# Patient Record
Sex: Female | Born: 1960 | Race: White | Hispanic: No | Marital: Married | Smoking: Former smoker
Health system: Southern US, Community
[De-identification: ages and names within clinical notes are randomized; demographics above are authoritative.]

## PROBLEM LIST (undated history)

## (undated) DIAGNOSIS — J45909 Unspecified asthma, uncomplicated: Secondary | ICD-10-CM

## (undated) HISTORY — PX: OOPHORECTOMY: SHX86

## (undated) HISTORY — PX: KNEE ARTHROSCOPY: SUR90

---

## 2005-05-22 ENCOUNTER — Ambulatory Visit: Payer: Self-pay | Admitting: Family Medicine

## 2006-07-09 ENCOUNTER — Ambulatory Visit: Payer: Self-pay | Admitting: Family Medicine

## 2007-07-12 ENCOUNTER — Ambulatory Visit: Payer: Self-pay | Admitting: Family Medicine

## 2008-07-13 ENCOUNTER — Ambulatory Visit: Payer: Self-pay | Admitting: Family Medicine

## 2009-07-16 ENCOUNTER — Ambulatory Visit: Payer: Self-pay | Admitting: Family Medicine

## 2010-07-30 ENCOUNTER — Ambulatory Visit: Payer: Self-pay | Admitting: Family Medicine

## 2010-08-18 ENCOUNTER — Ambulatory Visit: Payer: Self-pay | Admitting: Internal Medicine

## 2011-05-13 IMAGING — CR DG FOOT COMPLETE 3+V*L*
1 series · 3 of 3 positions shown · non-contrast
Comparison: none

REASON FOR EXAM: caught left side of foot and fourth and fith toes on
couch edge
COMMENTS:

PROCEDURE:     MDR - MDR FOOT LT COMP W/OBLQUES  - August 18, 2010  [DATE]
RESULT:     Comparison:  None

[Series 1: view not recorded · 0.17mm/px · 3 of 3 slices shown]
[im 1/3]
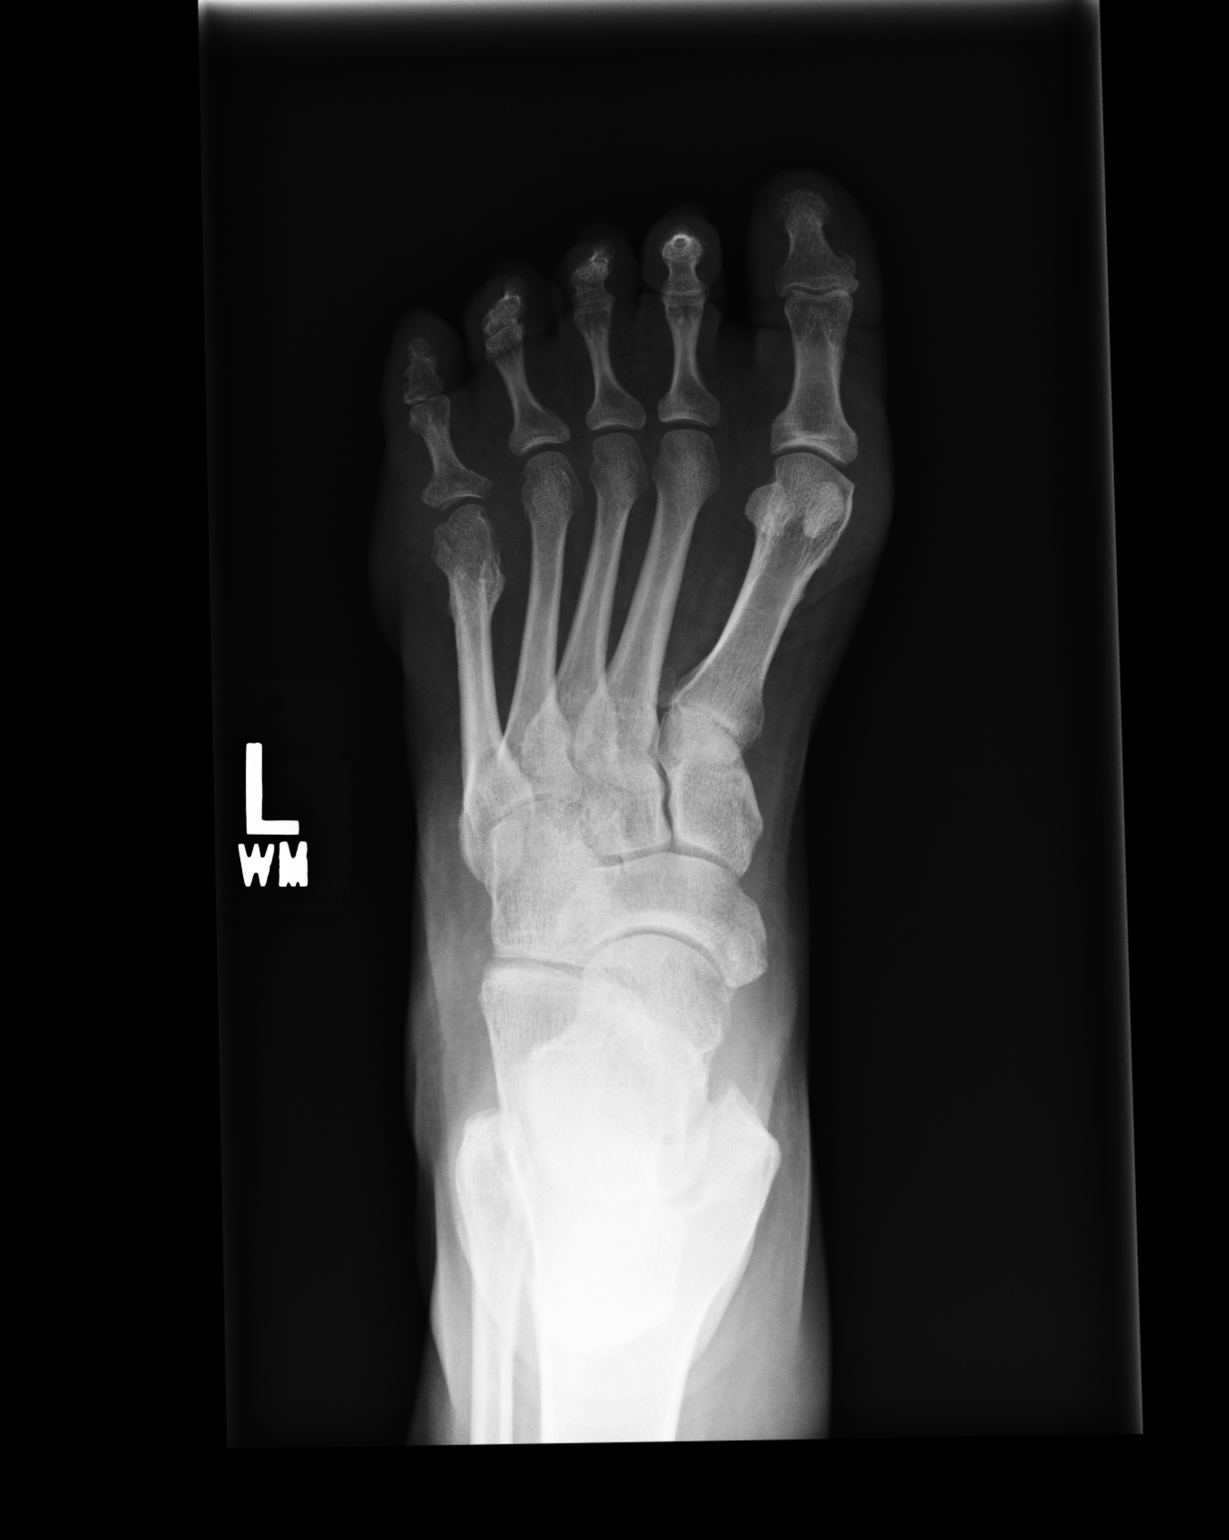
[im 2/3]
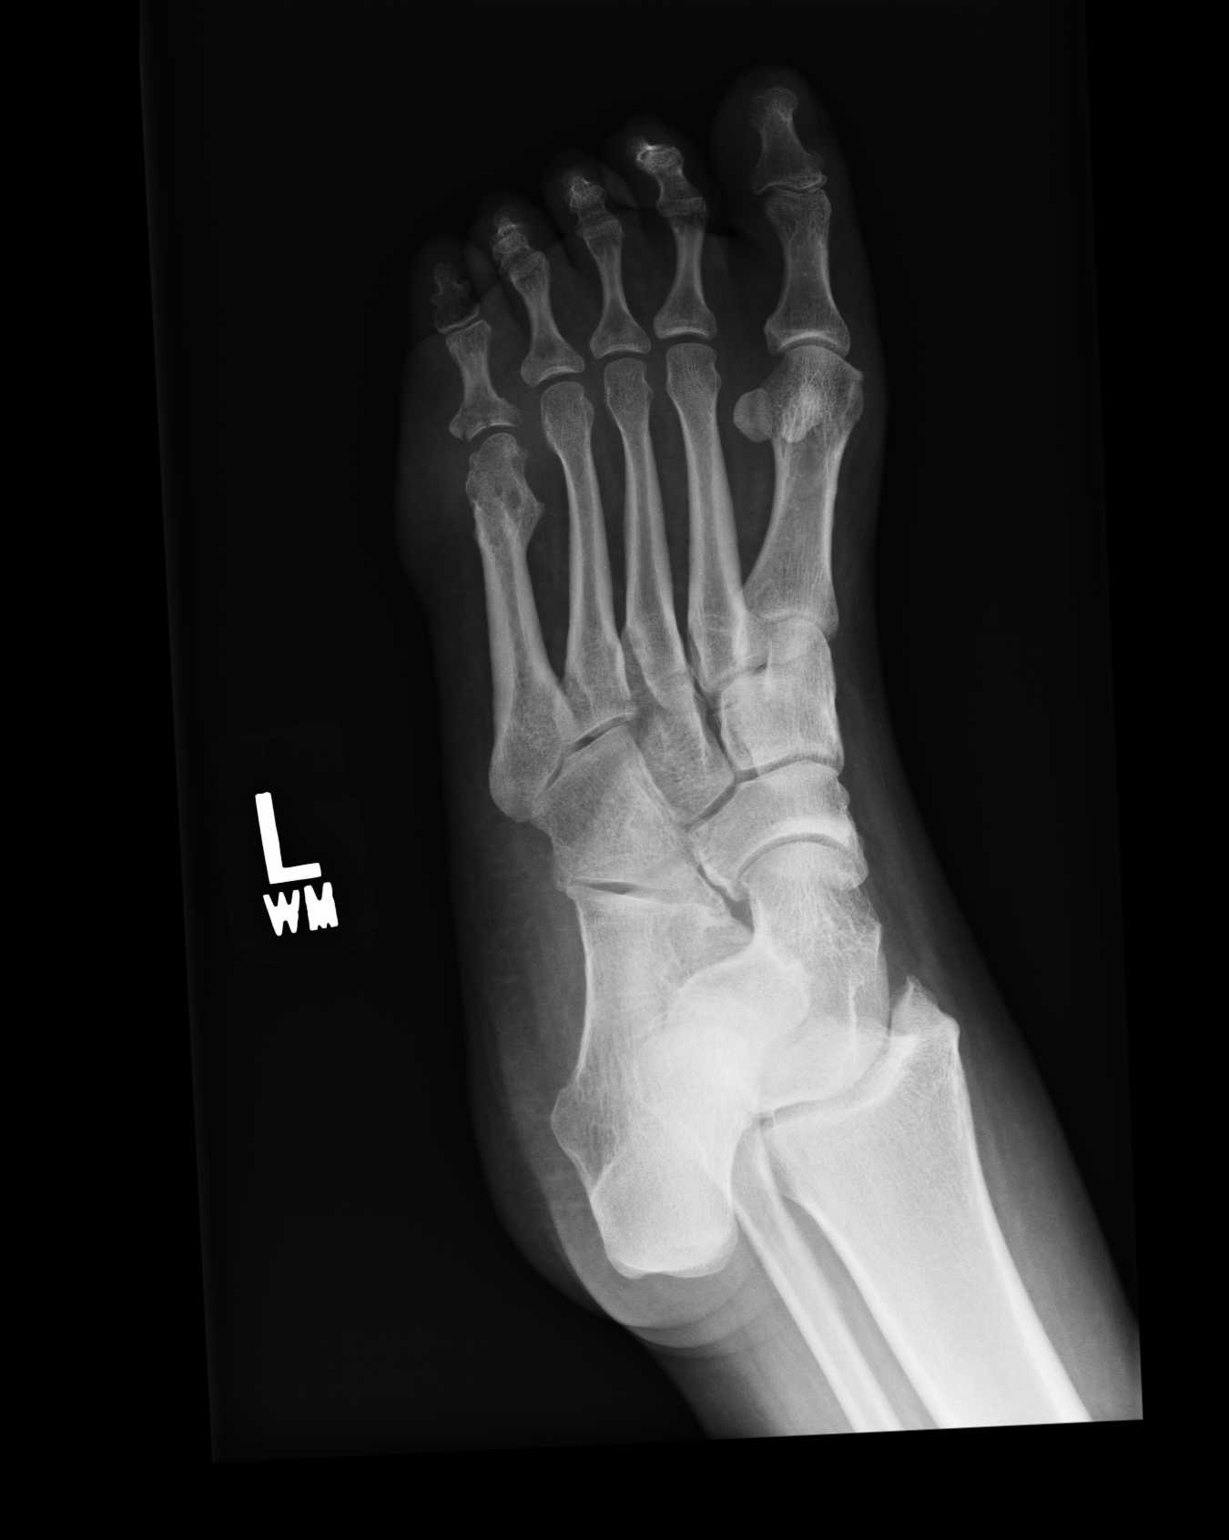
[im 3/3]
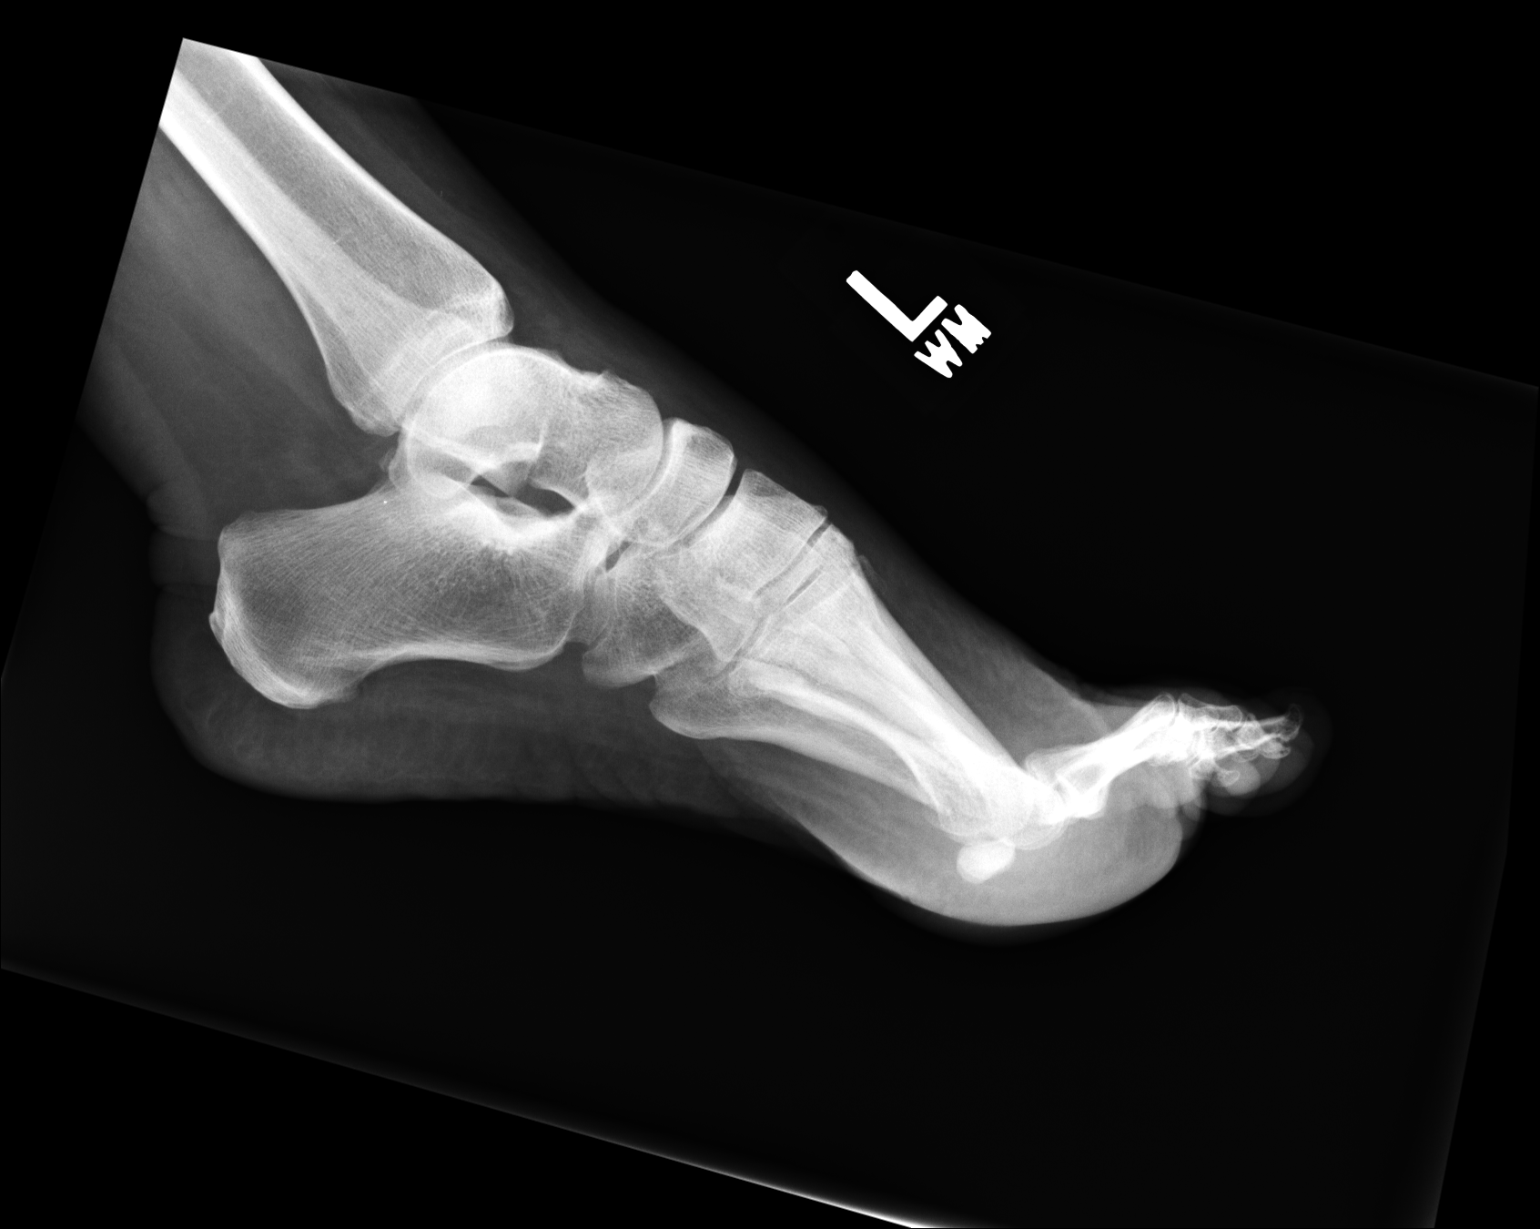

[3 of 3 positions shown; findings below may reference images not displayed]

FINDINGS: AP, oblique, and lateral views of the left foot demonstrates a nondisplaced
fracture of the base of the fifth proximal phalanx. There is no somatic
deformity of the fifth metatarsal neck.. There is no soft tissue
abnormality. There is no subcutaneous emphysema or radiopaque foreign bodies.
IMPRESSION: Nondisplaced fracture at the base of the fifth proximal phalanx of the left
foot.

## 2011-05-16 ENCOUNTER — Ambulatory Visit: Payer: Self-pay | Admitting: Orthopedic Surgery

## 2011-05-22 ENCOUNTER — Ambulatory Visit: Payer: Self-pay | Admitting: Orthopedic Surgery

## 2011-08-25 ENCOUNTER — Ambulatory Visit: Payer: Self-pay | Admitting: Family Medicine

## 2012-08-25 ENCOUNTER — Ambulatory Visit: Payer: Self-pay | Admitting: Family Medicine

## 2013-09-22 ENCOUNTER — Ambulatory Visit: Payer: Self-pay | Admitting: Family Medicine

## 2013-11-02 ENCOUNTER — Ambulatory Visit: Payer: Self-pay | Admitting: Family Medicine

## 2014-09-26 ENCOUNTER — Ambulatory Visit: Payer: Self-pay | Admitting: Family Medicine

## 2015-10-16 ENCOUNTER — Other Ambulatory Visit: Payer: Self-pay | Admitting: Family Medicine

## 2015-10-16 DIAGNOSIS — Z1231 Encounter for screening mammogram for malignant neoplasm of breast: Secondary | ICD-10-CM

## 2015-10-17 ENCOUNTER — Ambulatory Visit
Admission: RE | Admit: 2015-10-17 | Discharge: 2015-10-17 | Disposition: A | Discharge status: Home / Self Care | Payer: BLUE CROSS/BLUE SHIELD | Source: Ambulatory Visit | Attending: Family Medicine | Admitting: Family Medicine

## 2015-10-17 DIAGNOSIS — Z1231 Encounter for screening mammogram for malignant neoplasm of breast: Secondary | ICD-10-CM

## 2016-09-15 ENCOUNTER — Other Ambulatory Visit: Payer: Self-pay | Admitting: Family Medicine

## 2016-09-15 DIAGNOSIS — Z1231 Encounter for screening mammogram for malignant neoplasm of breast: Secondary | ICD-10-CM

## 2016-10-21 ENCOUNTER — Ambulatory Visit
Admission: RE | Admit: 2016-10-21 | Discharge: 2016-10-21 | Disposition: A | Discharge status: Home / Self Care | Payer: 59 | Source: Ambulatory Visit | Attending: Family Medicine | Admitting: Family Medicine

## 2016-10-21 DIAGNOSIS — Z1231 Encounter for screening mammogram for malignant neoplasm of breast: Secondary | ICD-10-CM | POA: Diagnosis present

## 2017-09-14 ENCOUNTER — Other Ambulatory Visit: Payer: Self-pay | Admitting: Family Medicine

## 2017-09-14 DIAGNOSIS — Z1231 Encounter for screening mammogram for malignant neoplasm of breast: Secondary | ICD-10-CM

## 2017-10-26 ENCOUNTER — Ambulatory Visit
Admission: RE | Admit: 2017-10-26 | Discharge: 2017-10-26 | Disposition: A | Discharge status: Home / Self Care | Payer: BLUE CROSS/BLUE SHIELD | Source: Ambulatory Visit | Attending: Family Medicine | Admitting: Family Medicine

## 2017-10-26 DIAGNOSIS — Z1231 Encounter for screening mammogram for malignant neoplasm of breast: Secondary | ICD-10-CM | POA: Insufficient documentation

## 2018-09-27 ENCOUNTER — Other Ambulatory Visit: Payer: Self-pay | Admitting: Family Medicine

## 2018-09-27 DIAGNOSIS — Z1231 Encounter for screening mammogram for malignant neoplasm of breast: Secondary | ICD-10-CM

## 2018-10-27 ENCOUNTER — Ambulatory Visit: Payer: BLUE CROSS/BLUE SHIELD

## 2018-11-23 ENCOUNTER — Ambulatory Visit
Admission: RE | Admit: 2018-11-23 | Discharge: 2018-11-23 | Disposition: A | Discharge status: Home / Self Care | Payer: BLUE CROSS/BLUE SHIELD | Source: Ambulatory Visit | Attending: Family Medicine | Admitting: Family Medicine

## 2018-11-23 ENCOUNTER — Encounter (INDEPENDENT_AMBULATORY_CARE_PROVIDER_SITE_OTHER): Payer: Self-pay

## 2018-11-23 DIAGNOSIS — Z1231 Encounter for screening mammogram for malignant neoplasm of breast: Secondary | ICD-10-CM

## 2019-04-18 ENCOUNTER — Other Ambulatory Visit: Payer: Self-pay | Admitting: Family Medicine

## 2019-04-18 DIAGNOSIS — N950 Postmenopausal bleeding: Secondary | ICD-10-CM

## 2019-05-02 ENCOUNTER — Ambulatory Visit
Admission: RE | Admit: 2019-05-02 | Discharge: 2019-05-02 | Disposition: A | Discharge status: Home / Self Care | Payer: BC Managed Care – PPO | Source: Ambulatory Visit | Attending: Family Medicine | Admitting: Family Medicine

## 2019-05-02 ENCOUNTER — Other Ambulatory Visit: Payer: Self-pay

## 2019-05-02 DIAGNOSIS — N950 Postmenopausal bleeding: Secondary | ICD-10-CM | POA: Diagnosis not present

## 2019-09-30 ENCOUNTER — Other Ambulatory Visit: Payer: Self-pay | Admitting: Family Medicine

## 2019-09-30 DIAGNOSIS — Z1231 Encounter for screening mammogram for malignant neoplasm of breast: Secondary | ICD-10-CM

## 2019-11-29 ENCOUNTER — Other Ambulatory Visit: Payer: Self-pay

## 2019-11-29 ENCOUNTER — Ambulatory Visit
Admission: RE | Admit: 2019-11-29 | Discharge: 2019-11-29 | Disposition: A | Discharge status: Home / Self Care | Payer: BC Managed Care – PPO | Source: Ambulatory Visit | Attending: Family Medicine | Admitting: Family Medicine

## 2019-11-29 DIAGNOSIS — Z1231 Encounter for screening mammogram for malignant neoplasm of breast: Secondary | ICD-10-CM | POA: Insufficient documentation

## 2019-12-31 DIAGNOSIS — U071 COVID-19: Secondary | ICD-10-CM

## 2019-12-31 HISTORY — DX: COVID-19: U07.1

## 2020-06-29 ENCOUNTER — Other Ambulatory Visit: Payer: Self-pay | Admitting: Obstetrics and Gynecology

## 2020-07-13 NOTE — H&P (View-Only) (Signed)
Nicole Stanley is a 59 y.o. female presenting with Pre Op Consulting (preop/sign consents)   Body mass index is 33.08 kg/m.  History of Present Illness: Patient is a patient of Dr. Guido Stanley. She presents for a preoperative visit for TLH/BSO. Surgical indications: postmenopausal bleeding, uterine fibroids, thickened endometrium on ultrasound. Pt had her initial PMB dx while on long-term OCPs in 2020, for which work up was completed, see below.   Of note, pt was on OCPs for several years up until age 56 with regular, interval, monthly periods. Additionally, pt has been with bothersome menopausal sx since age 25 and is s/p several interventions but most recently on Prempro with improvement except she is now with recent pelvic pain and pressure. Pt has requested hysterectomy and declines other interventions.  -04/2019: PMB dx, while on OCPs & having regular interval bleeding; TVUS: thickened endometrium of 77mm & multiple fibroids w/ largest measuring 6.1cm, pt saw CCW-->EMBx: benign, labs confirmed postmenopause -05/2019: stopped OCPs which she had been on since 2017 -02/2020: menopausal sx; start PremPro, vaginal estrogen -03/2020: new onset pelvic pain and pressure after starting Prempro but with improvements in menopausal sx --> pt now requests hysterectomy   Workup: Last pap 05/2018: neg/neg EMBx 05/2019:  -Inactive endometrium with reactive tubal metaplasia, neg for atypia & malignancy    (Most recent) TVUS 04/2020:  Fibroid Ut anteverted 8.2 x 5.5 x 4.5 cm 1) post=41 mm   2) rt lat=37 mm   3) rt ant=33 mm 4) lt lat=26 mm   5) post to endom=22 mm Endometrium=4.46 mm Bil ovs not seen, no adnexal masses seen  TVUS 04/2019: Ut: 9.6 x 6.7 x 6.8 cm = volume: 230 mL. A few uterine fibroids are seen, some which are partially calcified. Largest fibroid measures 6.1 cm in maximum diameter. Endometrium Thickness: 8 mm.  Neither ovary visualized, no adnexal masses  Pertinent Hx: -Regular period  with OCPs up until 58yo, LMP 06/2019 -OCPs restarted in 2017; Alyacen 1-35mg -mcg ; d/c late 2020 -Menopausal sx (hot flashes) since early 59yo, dyspareunia for vaginal dryness, 2021: thinning hair, burning tongue, weight gain   -S/p sprintec use  -02/2020: started vaginal estrogen & po Prempro daily, but pelvic cramping, pain, pressure & low back pain with Prempro but also much improvement -S/p cryotherapy for cervical dysplasia  -Last pap smear 05/2018 neg/neg  No hx of pelvic or gyn surgeries  Past Medical History:  has a past medical history of Abnormal cytology (Over 20 years ago), Allergic state (All my life), Arthritis (2012), Asthma, Cervical dysplasia, 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  Past Surgical History:  has a past surgical history that includes Knee arthroscopy (Right). Family History: family history includes Asthma in her father; COPD in her father; Cataracts in her mother; Charcot-Marie-Tooth disease in her maternal aunt, maternal grandfather, and mother; Coronary Artery Disease (Blocked arteries around heart) (age of onset: 77) in her maternal grandfather; Coronary Artery Disease (Blocked arteries around heart) (age of onset: 56) in her maternal uncle; Coronary Artery Disease (Blocked arteries around heart) (age of onset: 29) in her mother; Diabetes type II in her maternal grandfather and maternal uncle; Glaucoma in her maternal grandfather; High blood pressure (Hypertension) in her maternal grandfather and mother; Lung cancer in her maternal grandmother and mother; Osteoarthritis in her father; Osteoporosis (Thinning of bones) in her maternal aunt; Skin cancer in her maternal grandmother; Stroke in her maternal grandfather; Thyroid disease in  her mother. Social History:  reports that she quit smoking about 9 years ago. She has a 30.00  pack-year smoking history. She has never used smokeless tobacco. She reports current alcohol use. She reports that she does not use drugs. OB/GYN History:  OB History    Gravida  1   Para      Term      Preterm      AB  1   Living        SAB  1   TAB      Ectopic      Molar      Multiple      Live Births           Allergies: is allergic to penicillins. Medications: Current Outpatient Medications:  .  acetaminophen (TYLENOL) 500 MG tablet, Take by mouth, Disp: , Rfl:  .  docusate (COLACE) 100 MG capsule, Take by mouth, Disp: , Rfl:  .  estradioL (ESTRACE) 0.01 % (0.1 mg/gram) vaginal cream, Place 0.5 g vaginally twice a week Insert 1/4 applicator twice weekly, Disp: 30 g, Rfl: 2 .  estrogen, conjugated,-medroxyprogesterone (PREMPRO) 0.625-5 mg tablet, Take 1 tablet by mouth once daily, Disp: 1 Package, Rfl: 11 .  fluticasone propion-salmeteroL (ADVAIR DISKUS) 100-50 mcg/dose diskus inhaler, Inhale 1 inhalation into the lungs every 12 (twelve) hours, Disp: 1 each, Rfl: 11   Exam:   BP 142/80   Ht 153.7 cm (5' 0.5")   Wt 78.1 kg (172 lb 3.2 oz)   LMP 01/09/2015   BMI 33.08 kg/m   Constitutional:  General appearance: Well nourished, well developed female in no acute distress.  CV: RRR Pulm: CTAB Neuro/psych:  Normal mood and affect. No gross motor deficits. Neck:  Supple, normal appearance.  Respiratory:  Normal respiratory effort, no use of accessory muscles Skin:  No visible rashes or external lesions  Impression:   The primary encounter diagnosis was Preoperative clearance. Diagnoses of PMB (postmenopausal bleeding), Uterine leiomyoma, unspecified location, and Endometrial thickening on ultrasound were also pertinent to this visit.  Plan:   1. Preoperative Visit  Patient returns for a preoperative discussion regarding her plans to proceed with surgical treatment of her postmenopausal bleeding, uterine fibroids, thickened endometrium by total laparoscopic  hysterectomy with bilateral salpingo oophorectomy procedure. We may perform a cystoscopy to evaluate the urinary tract after the procedure, if surgically indicated for uro tract integrity.   The patient and I discussed the technical aspects of the procedure including the potential for risks and complications. These include but are not limited to the risk of infection requiring post-operative antibiotics or further procedures. We talked about the risk of injury to adjacent organs including bladder, bowel, ureter, blood vessels or nerves. We talked about the need to convert to an open incision. We talked about the possible need for blood transfusion. We talked about postop complications such as thromboembolic or cardiopulmonary complications. All of her questions were answered. Her preoperative exam was completed and the appropriate consents were signed. She is scheduled to undergo this procedure in the near future.  -Pt may stop her Prempro now since it is too costly per her. She would like to go ahead and start her new estrogen. Discussed with patient that this is appropriate.  -Will send in gabapentin along with Tylenol and Motrin; no progesterone indicated after surgery but pt okay to stop Prempro and start unopposed estrogen for 9 days.  Specific Peri-operative Considerations:  - Consent: obtained today - Health Maintenance: up  to date - Labs: CBC, CMP preoperatively - Studies: EKG, CXR preoperatively - Bowel Preparation: None required - Abx:  Ancef 2g - VTE ppx: SCDs perioperatively - Glucose Protocol: n/a - Beta-blockade: n/a  Diagnoses and all orders for this visit:  Preoperative clearance  PMB (postmenopausal bleeding)  Uterine leiomyoma, unspecified location  Endometrial thickening on ultrasound   Return for Postop check.

## 2020-07-13 NOTE — H&P (Signed)
Nicole Stanley is a 59 y.o. female presenting with Pre Op Consulting (preop/sign consents)   Body mass index is 33.08 kg/m.  History of Present Illness: Patient is a patient of Dr. Guido Sander. She presents for a preoperative visit for TLH/BSO. Surgical indications: postmenopausal bleeding, uterine fibroids, thickened endometrium on ultrasound. Pt had her initial PMB dx while on long-term OCPs in 2020, for which work up was completed, see below.   Of note, pt was on OCPs for several years up until age 2 with regular, interval, monthly periods. Additionally, pt has been with bothersome menopausal sx since age 84 and is s/p several interventions but most recently on Prempro with improvement except she is now with recent pelvic pain and pressure. Pt has requested hysterectomy and declines other interventions.  -04/2019: PMB dx, while on OCPs & having regular interval bleeding; TVUS: thickened endometrium of 1mm & multiple fibroids w/ largest measuring 6.1cm, pt saw CCW-->EMBx: benign, labs confirmed postmenopause -05/2019: stopped OCPs which she had been on since 2017 -02/2020: menopausal sx; start PremPro, vaginal estrogen -03/2020: new onset pelvic pain and pressure after starting Prempro but with improvements in menopausal sx --> pt now requests hysterectomy   Workup: Last pap 05/2018: neg/neg EMBx 05/2019:  -Inactive endometrium with reactive tubal metaplasia, neg for atypia & malignancy    (Most recent) TVUS 04/2020:  Fibroid Ut anteverted 8.2 x 5.5 x 4.5 cm 1) post=41 mm   2) rt lat=37 mm   3) rt ant=33 mm 4) lt lat=26 mm   5) post to endom=22 mm Endometrium=4.46 mm Bil ovs not seen, no adnexal masses seen  TVUS 04/2019: Ut: 9.6 x 6.7 x 6.8 cm = volume: 230 mL. A few uterine fibroids are seen, some which are partially calcified. Largest fibroid measures 6.1 cm in maximum diameter. Endometrium Thickness: 8 mm.  Neither ovary visualized, no adnexal masses  Pertinent Hx: -Regular period  with OCPs up until 58yo, LMP 06/2019 -OCPs restarted in 2017; Alyacen 1-35mg -mcg ; d/c late 2020 -Menopausal sx (hot flashes) since early 59yo, dyspareunia for vaginal dryness, 2021: thinning hair, burning tongue, weight gain   -S/p sprintec use  -02/2020: started vaginal estrogen & po Prempro daily, but pelvic cramping, pain, pressure & low back pain with Prempro but also much improvement -S/p cryotherapy for cervical dysplasia  -Last pap smear 05/2018 neg/neg  No hx of pelvic or gyn surgeries  Past Medical History:  has a past medical history of Abnormal cytology (Over 20 years ago), Allergic state (All my life), Arthritis (2012), Asthma, Cervical dysplasia, 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  Past Surgical History:  has a past surgical history that includes Knee arthroscopy (Right). Family History: family history includes Asthma in her father; COPD in her father; Cataracts in her mother; Charcot-Marie-Tooth disease in her maternal aunt, maternal grandfather, and mother; Coronary Artery Disease (Blocked arteries around heart) (age of onset: 40) in her maternal grandfather; Coronary Artery Disease (Blocked arteries around heart) (age of onset: 20) in her maternal uncle; Coronary Artery Disease (Blocked arteries around heart) (age of onset: 67) in her mother; Diabetes type II in her maternal grandfather and maternal uncle; Glaucoma in her maternal grandfather; High blood pressure (Hypertension) in her maternal grandfather and mother; Lung cancer in her maternal grandmother and mother; Osteoarthritis in her father; Osteoporosis (Thinning of bones) in her maternal aunt; Skin cancer in her maternal grandmother; Stroke in her maternal grandfather; Thyroid disease in  her mother. Social History:  reports that she quit smoking about 9 years ago. She has a 30.00  pack-year smoking history. She has never used smokeless tobacco. She reports current alcohol use. She reports that she does not use drugs. OB/GYN History:  OB History    Gravida  1   Para      Term      Preterm      AB  1   Living        SAB  1   TAB      Ectopic      Molar      Multiple      Live Births           Allergies: is allergic to penicillins. Medications: Current Outpatient Medications:  .  acetaminophen (TYLENOL) 500 MG tablet, Take by mouth, Disp: , Rfl:  .  docusate (COLACE) 100 MG capsule, Take by mouth, Disp: , Rfl:  .  estradioL (ESTRACE) 0.01 % (0.1 mg/gram) vaginal cream, Place 0.5 g vaginally twice a week Insert 1/4 applicator twice weekly, Disp: 30 g, Rfl: 2 .  estrogen, conjugated,-medroxyprogesterone (PREMPRO) 0.625-5 mg tablet, Take 1 tablet by mouth once daily, Disp: 1 Package, Rfl: 11 .  fluticasone propion-salmeteroL (ADVAIR DISKUS) 100-50 mcg/dose diskus inhaler, Inhale 1 inhalation into the lungs every 12 (twelve) hours, Disp: 1 each, Rfl: 11   Exam:   BP 142/80   Ht 153.7 cm (5' 0.5")   Wt 78.1 kg (172 lb 3.2 oz)   LMP 01/09/2015   BMI 33.08 kg/m   Constitutional:  General appearance: Well nourished, well developed female in no acute distress.  CV: RRR Pulm: CTAB Neuro/psych:  Normal mood and affect. No gross motor deficits. Neck:  Supple, normal appearance.  Respiratory:  Normal respiratory effort, no use of accessory muscles Skin:  No visible rashes or external lesions  Impression:   The primary encounter diagnosis was Preoperative clearance. Diagnoses of PMB (postmenopausal bleeding), Uterine leiomyoma, unspecified location, and Endometrial thickening on ultrasound were also pertinent to this visit.  Plan:   1. Preoperative Visit  Patient returns for a preoperative discussion regarding her plans to proceed with surgical treatment of her postmenopausal bleeding, uterine fibroids, thickened endometrium by total laparoscopic  hysterectomy with bilateral salpingo oophorectomy procedure. We may perform a cystoscopy to evaluate the urinary tract after the procedure, if surgically indicated for uro tract integrity.   The patient and I discussed the technical aspects of the procedure including the potential for risks and complications. These include but are not limited to the risk of infection requiring post-operative antibiotics or further procedures. We talked about the risk of injury to adjacent organs including bladder, bowel, ureter, blood vessels or nerves. We talked about the need to convert to an open incision. We talked about the possible need for blood transfusion. We talked about postop complications such as thromboembolic or cardiopulmonary complications. All of her questions were answered. Her preoperative exam was completed and the appropriate consents were signed. She is scheduled to undergo this procedure in the near future.  -Pt may stop her Prempro now since it is too costly per her. She would like to go ahead and start her new estrogen. Discussed with patient that this is appropriate.  -Will send in gabapentin along with Tylenol and Motrin; no progesterone indicated after surgery but pt okay to stop Prempro and start unopposed estrogen for 9 days.  Specific Peri-operative Considerations:  - Consent: obtained today - Health Maintenance: up  to date - Labs: CBC, CMP preoperatively - Studies: EKG, CXR preoperatively - Bowel Preparation: None required - Abx:  Ancef 2g - VTE ppx: SCDs perioperatively - Glucose Protocol: n/a - Beta-blockade: n/a  Diagnoses and all orders for this visit:  Preoperative clearance  PMB (postmenopausal bleeding)  Uterine leiomyoma, unspecified location  Endometrial thickening on ultrasound   Return for Postop check.

## 2020-07-16 ENCOUNTER — Encounter
Admission: RE | Admit: 2020-07-16 | Discharge: 2020-07-16 | Disposition: A | Discharge status: Home / Self Care | Payer: BC Managed Care – PPO | Source: Ambulatory Visit | Attending: Obstetrics and Gynecology | Admitting: Obstetrics and Gynecology

## 2020-07-16 ENCOUNTER — Other Ambulatory Visit: Payer: Self-pay

## 2020-07-16 HISTORY — DX: Unspecified asthma, uncomplicated: J45.909

## 2020-07-16 NOTE — Patient Instructions (Signed)
Your procedure is scheduled on: Mon 10/4 Report to Day Surgery. To find out your arrival time please call [phone] between 1PM - 3PM on Friday 10/1 .  Remember: Instructions that are not followed completely may result in serious medical risk,  up to and including death, or upon the discretion of your surgeon and anesthesiologist your  surgery may need to be rescheduled.     _X__ 1. Do not eat food after midnight the night before your procedure.                 No chewing gum or hard candies. You may drink clear liquids up to 2 hours                 before you are scheduled to arrive for your surgery- DO not drink clear                 liquids within 2 hours of the start of your surgery.                 Clear Liquids include:  water, apple juice without pulp, clear Gatorade, G2 or                  Gatorade Zero (avoid Red/Purple/Blue), Black Coffee or Tea (Do not add                 anything to coffee or tea). __x___2.   Complete the "Ensure Clear Pre-surgery Clear Carbohydrate Drink" provided to you, 2 hours before arrival. **If you       are diabetic you will be provided with an alternative drink, Gatorade Zero or G2.  __X__2.  On the morning of surgery brush your teeth with toothpaste and water, you                may rinse your mouth with mouthwash if you wish.  Do not swallow any toothpaste of mouthwash.     _X__ 3.  No Alcohol for 24 hours before or after surgery.   ___ 4.  Do Not Smoke or use e-cigarettes For 24 Hours Prior to Your Surgery.                 Do not use any chewable tobacco products for at least 6 hours prior to                 Surgery.  ___  5.  Do not use any recreational drugs (marijuana, cocaine, heroin, ecstasy, MDMA or other)                For at least one week prior to your surgery.  Combination of these drugs with anesthesia                May have life threatening results.  ____  6.  Bring all medications with you on the day of  surgery if instructed.   __x__  7.  Notify your doctor if there is any change in your medical condition      (cold, fever, infections).     Do not wear jewelry, make-up, hairpins, clips or nail polish. Do not wear lotions, powders, or perfumes. You may wear deodorant. Do not shave 48 hours prior to surgery. Do not bring valuables to the hospital.    [facility] is not responsible for any belongings or valuables.  Contacts, dentures or bridgework may not be worn into surgery. Leave your suitcase in the car. After  surgery it may be brought to your room. For patients admitted to the hospital, discharge time is determined by your treatment team.   Patients discharged the day of surgery will not be allowed to drive home.   Make arrangements for someone to be with you for the first 24 hours of your Same Day Discharge.    Please read over the following fact sheets that you were given:    ____ Take these medicines the morning of surgery with A SIP OF WATER:    1. none  2.   3.   4.  5.  6.  ____ Fleet Enema (as directed)   __x__ Use CHG Soap (or wipes) as directed  ____ Use Benzoyl Peroxide Gel as instructed  __x__ Use inhalers on the day of surgery ADVAIR DISKUS 100-50 MCG/DOSE AEPB  ____ Stop metformin 2 days prior to surgery    ____ Take 1/2 of usual insulin dose the night before surgery. No insulin the morning          of surgery.   ____ Stop Coumadin/Plavix/aspirin on   __x__ Stop Anti-inflammatories no ibuprofen aleve or aspirin      May take tylenol   ____ Stop supplements until after surgery.    ____ Bring C-Pap to the hospital.    If you have any questions regarding your pre-procedure instructions,  Please call Pre-admit Testing at [facility]

## 2020-07-19 ENCOUNTER — Other Ambulatory Visit: Payer: Self-pay

## 2020-07-19 ENCOUNTER — Encounter (HOSPITAL_COMMUNITY): Payer: Self-pay | Admitting: Urgent Care

## 2020-07-19 ENCOUNTER — Encounter: Payer: Self-pay | Admitting: Obstetrics and Gynecology

## 2020-07-19 ENCOUNTER — Other Ambulatory Visit
Admission: RE | Admit: 2020-07-19 | Discharge: 2020-07-19 | Disposition: A | Discharge status: Home / Self Care | Payer: BC Managed Care – PPO | Source: Ambulatory Visit | Attending: Obstetrics and Gynecology | Admitting: Obstetrics and Gynecology

## 2020-07-19 DIAGNOSIS — U071 COVID-19: Secondary | ICD-10-CM | POA: Insufficient documentation

## 2020-07-19 DIAGNOSIS — Z01812 Encounter for preprocedural laboratory examination: Secondary | ICD-10-CM | POA: Insufficient documentation

## 2020-07-19 LAB — SARS CORONAVIRUS 2 (TAT 6-24 HRS): SARS Coronavirus 2: POSITIVE — AB

## 2020-07-19 LAB — TYPE AND SCREEN
ABO/RH(D): A POS
Antibody Screen: NEGATIVE

## 2020-07-19 LAB — CBC
HCT: 33.5 % — ABNORMAL LOW (ref 36.0–46.0)
Hemoglobin: 11.6 g/dL — ABNORMAL LOW (ref 12.0–15.0)
MCH: 31.6 pg (ref 26.0–34.0)
MCHC: 34.6 g/dL (ref 30.0–36.0)
MCV: 91.3 fL (ref 80.0–100.0)
Platelets: 301 10*3/uL (ref 150–400)
RBC: 3.67 MIL/uL — ABNORMAL LOW (ref 3.87–5.11)
RDW: 12.5 % (ref 11.5–15.5)
WBC: 7.7 10*3/uL (ref 4.0–10.5)
nRBC: 0 % (ref 0.0–0.2)

## 2020-07-19 LAB — BASIC METABOLIC PANEL
Anion gap: 10 (ref 5–15)
BUN: 10 mg/dL (ref 6–20)
CO2: 25 mmol/L (ref 22–32)
Calcium: 8.7 mg/dL — ABNORMAL LOW (ref 8.9–10.3)
Chloride: 107 mmol/L (ref 98–111)
Creatinine, Ser: 0.49 mg/dL (ref 0.44–1.00)
GFR calc Af Amer: >60 mL/min (ref >60)
GFR calc non Af Amer: >60 mL/min (ref >60)
Glucose, Bld: 89 mg/dL (ref 70–99)
Potassium: 3.3 mmol/L — ABNORMAL LOW (ref 3.5–5.1)
Sodium: 142 mmol/L (ref 135–145)

## 2020-07-20 HISTORY — PX: ABDOMINAL HYSTERECTOMY: SHX81

## 2020-07-23 ENCOUNTER — Encounter: Admission: RE | Payer: Self-pay | Source: Home / Self Care

## 2020-07-23 ENCOUNTER — Ambulatory Visit
Admission: RE | Admit: 2020-07-23 | Payer: BC Managed Care – PPO | Source: Home / Self Care | Admitting: Obstetrics and Gynecology

## 2020-07-23 SURGERY — HYSTERECTOMY, TOTAL, LAPAROSCOPIC, WITH BILATERAL SALPINGO-OOPHORECTOMY
Anesthesia: Choice

## 2020-07-26 ENCOUNTER — Other Ambulatory Visit: Payer: Self-pay

## 2020-07-26 ENCOUNTER — Other Ambulatory Visit
Admission: RE | Admit: 2020-07-26 | Discharge: 2020-07-26 | Disposition: A | Discharge status: Home / Self Care | Payer: BC Managed Care – PPO | Source: Ambulatory Visit | Attending: Obstetrics and Gynecology | Admitting: Obstetrics and Gynecology

## 2020-07-26 DIAGNOSIS — Z01818 Encounter for other preprocedural examination: Secondary | ICD-10-CM | POA: Insufficient documentation

## 2020-07-26 DIAGNOSIS — Z20822 Contact with and (suspected) exposure to covid-19: Secondary | ICD-10-CM | POA: Diagnosis not present

## 2020-07-26 LAB — SARS CORONAVIRUS 2 (TAT 6-24 HRS): SARS Coronavirus 2: NEGATIVE

## 2020-07-29 MED ORDER — POVIDONE-IODINE 10 % EX SWAB: 2.0000 "application " | Freq: Once | CUTANEOUS | Status: DC

## 2020-07-29 MED ORDER — LACTATED RINGERS IV SOLN: INTRAVENOUS | Status: DC

## 2020-07-29 MED ORDER — GENTAMICIN SULFATE 40 MG/ML IJ SOLN
5.0000 mg/kg | INTRAVENOUS | Status: DC
  Filled 2020-07-29: qty 9.75

## 2020-07-29 MED ORDER — CLINDAMYCIN PHOSPHATE 900 MG/50ML IV SOLN
900.0000 mg | INTRAVENOUS | Status: AC
  Administered 2020-07-30: 900 mg via INTRAVENOUS

## 2020-07-30 ENCOUNTER — Encounter
Admission: RE | Disposition: A | Discharge status: Home / Self Care | Payer: Self-pay | Source: Home / Self Care | Attending: Obstetrics and Gynecology

## 2020-07-30 ENCOUNTER — Encounter: Payer: Self-pay | Admitting: Obstetrics and Gynecology

## 2020-07-30 ENCOUNTER — Other Ambulatory Visit: Payer: Self-pay

## 2020-07-30 ENCOUNTER — Ambulatory Visit: Payer: BC Managed Care – PPO | Admitting: Certified Registered"

## 2020-07-30 ENCOUNTER — Ambulatory Visit
Admission: RE | Admit: 2020-07-30 | Discharge: 2020-07-30 | Disposition: A | Discharge status: Home / Self Care | Payer: BC Managed Care – PPO | Attending: Obstetrics and Gynecology | Admitting: Obstetrics and Gynecology

## 2020-07-30 DIAGNOSIS — J45909 Unspecified asthma, uncomplicated: Secondary | ICD-10-CM | POA: Diagnosis not present

## 2020-07-30 DIAGNOSIS — M1990 Unspecified osteoarthritis, unspecified site: Secondary | ICD-10-CM | POA: Insufficient documentation

## 2020-07-30 DIAGNOSIS — Z880 Allergy status to penicillin: Secondary | ICD-10-CM | POA: Insufficient documentation

## 2020-07-30 DIAGNOSIS — N838 Other noninflammatory disorders of ovary, fallopian tube and broad ligament: Secondary | ICD-10-CM | POA: Diagnosis not present

## 2020-07-30 DIAGNOSIS — D251 Intramural leiomyoma of uterus: Secondary | ICD-10-CM | POA: Insufficient documentation

## 2020-07-30 DIAGNOSIS — Z87891 Personal history of nicotine dependence: Secondary | ICD-10-CM | POA: Insufficient documentation

## 2020-07-30 DIAGNOSIS — D259 Leiomyoma of uterus, unspecified: Secondary | ICD-10-CM | POA: Diagnosis present

## 2020-07-30 HISTORY — PX: TOTAL LAPAROSCOPIC HYSTERECTOMY WITH BILATERAL SALPINGO OOPHORECTOMY: SHX6845

## 2020-07-30 HISTORY — PX: CYSTOSCOPY: SHX5120

## 2020-07-30 LAB — ABO/RH: ABO/RH(D): A POS

## 2020-07-30 SURGERY — HYSTERECTOMY, TOTAL, LAPAROSCOPIC, WITH BILATERAL SALPINGO-OOPHORECTOMY
Anesthesia: General | Laterality: Bilateral

## 2020-07-30 MED ORDER — BUPIVACAINE HCL 0.5 % IJ SOLN
INTRAMUSCULAR | Status: DC | PRN
  Administered 2020-07-30: 13 mL

## 2020-07-30 MED ORDER — FENTANYL CITRATE (PF) 100 MCG/2ML IJ SOLN
INTRAMUSCULAR | Status: AC
  Filled 2020-07-30: qty 2

## 2020-07-30 MED ORDER — PROPOFOL 10 MG/ML IV BOLUS
INTRAVENOUS | Status: DC | PRN
  Administered 2020-07-30: 160 mg via INTRAVENOUS

## 2020-07-30 MED ORDER — ACETAMINOPHEN 10 MG/ML IV SOLN
INTRAVENOUS | Status: DC | PRN
  Administered 2020-07-30: 1000 mg via INTRAVENOUS

## 2020-07-30 MED ORDER — FENTANYL CITRATE (PF) 100 MCG/2ML IJ SOLN
25.0000 ug | INTRAMUSCULAR | Status: DC | PRN
  Administered 2020-07-30: 25 ug via INTRAVENOUS

## 2020-07-30 MED ORDER — FENTANYL CITRATE (PF) 100 MCG/2ML IJ SOLN
INTRAMUSCULAR | Status: AC
  Administered 2020-07-30: 25 ug via INTRAVENOUS
  Filled 2020-07-30: qty 2

## 2020-07-30 MED ORDER — IBUPROFEN 800 MG PO TABS: 800.0000 mg | ORAL_TABLET | Freq: Three times a day (TID) | ORAL | 1 refills | Status: AC

## 2020-07-30 MED ORDER — ROCURONIUM BROMIDE 10 MG/ML (PF) SYRINGE
PREFILLED_SYRINGE | INTRAVENOUS | Status: AC
  Filled 2020-07-30: qty 10

## 2020-07-30 MED ORDER — CLINDAMYCIN PHOSPHATE 900 MG/50ML IV SOLN
INTRAVENOUS | Status: AC
  Filled 2020-07-30: qty 50

## 2020-07-30 MED ORDER — GABAPENTIN 800 MG PO TABS: 800.0000 mg | ORAL_TABLET | Freq: Every day | ORAL | 0 refills | Status: DC

## 2020-07-30 MED ORDER — ONDANSETRON HCL 4 MG/2ML IJ SOLN
INTRAMUSCULAR | Status: DC | PRN
  Administered 2020-07-30: 4 mg via INTRAVENOUS

## 2020-07-30 MED ORDER — SEVOFLURANE IN SOLN
RESPIRATORY_TRACT | Status: AC
  Filled 2020-07-30: qty 250

## 2020-07-30 MED ORDER — SUGAMMADEX SODIUM 200 MG/2ML IV SOLN
INTRAVENOUS | Status: DC | PRN
  Administered 2020-07-30: 200 mg via INTRAVENOUS

## 2020-07-30 MED ORDER — PROMETHAZINE HCL 25 MG/ML IJ SOLN: 6.2500 mg | INTRAMUSCULAR | Status: DC | PRN

## 2020-07-30 MED ORDER — CHLORHEXIDINE GLUCONATE 0.12 % MT SOLN: 15.0000 mL | Freq: Once | OROMUCOSAL | Status: AC

## 2020-07-30 MED ORDER — ROCURONIUM BROMIDE 100 MG/10ML IV SOLN
INTRAVENOUS | Status: DC | PRN
  Administered 2020-07-30: 30 mg via INTRAVENOUS
  Administered 2020-07-30: 20 mg via INTRAVENOUS
  Administered 2020-07-30: 10 mg via INTRAVENOUS
  Administered 2020-07-30: 50 mg via INTRAVENOUS

## 2020-07-30 MED ORDER — ACETAMINOPHEN 500 MG PO TABS: 1000.0000 mg | ORAL_TABLET | Freq: Four times a day (QID) | ORAL | 0 refills | Status: AC

## 2020-07-30 MED ORDER — DOCUSATE SODIUM 100 MG PO CAPS: 100.0000 mg | ORAL_CAPSULE | Freq: Two times a day (BID) | ORAL | 0 refills | Status: DC

## 2020-07-30 MED ORDER — PROPOFOL 10 MG/ML IV BOLUS
INTRAVENOUS | Status: AC
  Filled 2020-07-30: qty 20

## 2020-07-30 MED ORDER — ACETAMINOPHEN 10 MG/ML IV SOLN
INTRAVENOUS | Status: AC
  Filled 2020-07-30: qty 100

## 2020-07-30 MED ORDER — OXYCODONE HCL 5 MG PO TABS: 5.0000 mg | ORAL_TABLET | ORAL | 0 refills | Status: DC | PRN

## 2020-07-30 MED ORDER — DEXAMETHASONE SODIUM PHOSPHATE 10 MG/ML IJ SOLN
INTRAMUSCULAR | Status: DC | PRN
  Administered 2020-07-30: 10 mg via INTRAVENOUS

## 2020-07-30 MED ORDER — ORAL CARE MOUTH RINSE: 15.0000 mL | Freq: Once | OROMUCOSAL | Status: AC

## 2020-07-30 MED ORDER — BUPIVACAINE HCL (PF) 0.5 % IJ SOLN
INTRAMUSCULAR | Status: AC
  Filled 2020-07-30: qty 30

## 2020-07-30 MED ORDER — DEXAMETHASONE SODIUM PHOSPHATE 10 MG/ML IJ SOLN
INTRAMUSCULAR | Status: AC
  Filled 2020-07-30: qty 1

## 2020-07-30 MED ORDER — MIDAZOLAM HCL 2 MG/2ML IJ SOLN
INTRAMUSCULAR | Status: AC
  Filled 2020-07-30: qty 2

## 2020-07-30 MED ORDER — FENTANYL CITRATE (PF) 100 MCG/2ML IJ SOLN
INTRAMUSCULAR | Status: DC | PRN
  Administered 2020-07-30 (×4): 50 ug via INTRAVENOUS

## 2020-07-30 MED ORDER — CHLORHEXIDINE GLUCONATE 0.12 % MT SOLN
OROMUCOSAL | Status: AC
  Administered 2020-07-30: 15 mL via OROMUCOSAL
  Filled 2020-07-30: qty 15

## 2020-07-30 MED ORDER — LIDOCAINE HCL (PF) 2 % IJ SOLN
INTRAMUSCULAR | Status: AC
  Filled 2020-07-30: qty 5

## 2020-07-30 MED ORDER — ONDANSETRON 4 MG PO TBDP: 4.0000 mg | ORAL_TABLET | Freq: Four times a day (QID) | ORAL | 0 refills | Status: DC | PRN

## 2020-07-30 MED ORDER — ONDANSETRON HCL 4 MG/2ML IJ SOLN
INTRAMUSCULAR | Status: AC
  Filled 2020-07-30: qty 2

## 2020-07-30 MED ORDER — LIDOCAINE HCL (CARDIAC) PF 100 MG/5ML IV SOSY
PREFILLED_SYRINGE | INTRAVENOUS | Status: DC | PRN
  Administered 2020-07-30: 100 mg via INTRAVENOUS

## 2020-07-30 SURGICAL SUPPLY — 64 items
ADH SKN CLS APL DERMABOND .7 (GAUZE/BANDAGES/DRESSINGS) ×2
APL PRP STRL LF DISP 70% ISPRP (MISCELLANEOUS) ×2
APL SRG 38 LTWT LNG FL B (MISCELLANEOUS) ×2
APPLICATOR ARISTA FLEXITIP XL (MISCELLANEOUS) ×3 IMPLANT
BAG DRN RND TRDRP ANRFLXCHMBR (UROLOGICAL SUPPLIES) ×4
BAG URINE DRAIN 2000ML AR STRL (UROLOGICAL SUPPLIES) ×6 IMPLANT
BLADE SURG SZ11 CARB STEEL (BLADE) ×3 IMPLANT
CATH FOLEY 2WAY  5CC 16FR (CATHETERS) ×1
CATH FOLEY 2WAY 5CC 16FR (CATHETERS) ×2
CATH URTH 16FR FL 2W BLN LF (CATHETERS) ×2 IMPLANT
CHLORAPREP W/TINT 26 (MISCELLANEOUS) ×3 IMPLANT
CORD MONOPOLAR M/FML 12FT (MISCELLANEOUS) ×3 IMPLANT
COUNTER NEEDLE 20/40 LG (NEEDLE) ×6 IMPLANT
COVER LIGHT HANDLE STERIS (MISCELLANEOUS) ×6 IMPLANT
COVER WAND RF STERILE (DRAPES) ×3 IMPLANT
DERMABOND ADVANCED (GAUZE/BANDAGES/DRESSINGS) ×1
DERMABOND ADVANCED .7 DNX12 (GAUZE/BANDAGES/DRESSINGS) ×2 IMPLANT
DEVICE SUTURE ENDOST 10MM (ENDOMECHANICALS) ×3 IMPLANT
DRAPE GENERAL ENDO 106X123.5 (DRAPES) ×3 IMPLANT
DRSG TEGADERM 2-3/8X2-3/4 SM (GAUZE/BANDAGES/DRESSINGS) ×9 IMPLANT
GAUZE 4X4 16PLY RFD (DISPOSABLE) ×3 IMPLANT
GLOVE BIO SURGEON STRL SZ7 (GLOVE) ×9 IMPLANT
GLOVE INDICATOR 7.5 STRL GRN (GLOVE) ×6 IMPLANT
GOWN STRL REUS W/ TWL LRG LVL3 (GOWN DISPOSABLE) ×6 IMPLANT
GOWN STRL REUS W/ TWL XL LVL3 (GOWN DISPOSABLE) ×2 IMPLANT
GOWN STRL REUS W/TWL LRG LVL3 (GOWN DISPOSABLE) ×9
GOWN STRL REUS W/TWL XL LVL3 (GOWN DISPOSABLE) ×3
GRASPER SUT TROCAR 14GX15 (MISCELLANEOUS) ×3 IMPLANT
HEMOSTAT ARISTA ABSORB 3G PWDR (HEMOSTASIS) ×3 IMPLANT
IRRIGATION STRYKERFLOW (MISCELLANEOUS) ×2 IMPLANT
IRRIGATOR STRYKERFLOW (MISCELLANEOUS) ×3
KIT PINK PAD W/HEAD ARE REST (MISCELLANEOUS) ×3
KIT PINK PAD W/HEAD ARM REST (MISCELLANEOUS) ×2 IMPLANT
KIT TURNOVER CYSTO (KITS) ×3 IMPLANT
L-HOOK LAP DISP 36CM (ELECTROSURGICAL)
LABEL OR SOLS (LABEL) ×3 IMPLANT
LHOOK LAP DISP 36CM (ELECTROSURGICAL) IMPLANT
LIGASURE VESSEL 5MM BLUNT TIP (ELECTROSURGICAL) IMPLANT
MANIPULATOR VCARE LG CRV RETR (MISCELLANEOUS) IMPLANT
MANIPULATOR VCARE SML CRV RETR (MISCELLANEOUS) IMPLANT
MANIPULATOR VCARE STD CRV RETR (MISCELLANEOUS) ×3 IMPLANT
NS IRRIG 500ML POUR BTL (IV SOLUTION) ×3 IMPLANT
OCCLUDER COLPOPNEUMO (BALLOONS) ×3 IMPLANT
PACK GYN LAPAROSCOPIC (MISCELLANEOUS) ×3 IMPLANT
PAD OB MATERNITY 4.3X12.25 (PERSONAL CARE ITEMS) ×3 IMPLANT
PAD PREP 24X41 OB/GYN DISP (PERSONAL CARE ITEMS) ×3 IMPLANT
PENCIL ELECTRO HAND CTR (MISCELLANEOUS) IMPLANT
SCISSORS METZENBAUM CVD 33 (INSTRUMENTS) IMPLANT
SET CYSTO W/LG BORE CLAMP LF (SET/KITS/TRAYS/PACK) IMPLANT
SLEEVE ENDOPATH XCEL 5M (ENDOMECHANICALS) ×3 IMPLANT
SPONGE GAUZE 2X2 8PLY STRL LF (GAUZE/BANDAGES/DRESSINGS) ×6 IMPLANT
SUT ENDO VLOC 180-0-8IN (SUTURE) ×6 IMPLANT
SUT MNCRL 4-0 (SUTURE) ×3
SUT MNCRL 4-0 27XMFL (SUTURE) ×2
SUT VIC AB 0 CT1 36 (SUTURE) ×12 IMPLANT
SUT VIC AB 0 CT2 27 (SUTURE) ×3 IMPLANT
SUTURE MNCRL 4-0 27XMF (SUTURE) ×2 IMPLANT
SYR 10ML LL (SYRINGE) ×3 IMPLANT
SYR 50ML LL SCALE MARK (SYRINGE) ×3 IMPLANT
SYR TOOMEY IRRIG 70ML (MISCELLANEOUS) ×3
SYRINGE TOOMEY IRRIG 70ML (MISCELLANEOUS) ×2 IMPLANT
TROCAR ENDO BLADELESS 11MM (ENDOMECHANICALS) ×3 IMPLANT
TROCAR XCEL NON-BLD 5MMX100MML (ENDOMECHANICALS) ×3 IMPLANT
TUBING EVAC SMOKE HEATED PNEUM (TUBING) ×3 IMPLANT

## 2020-07-30 NOTE — Anesthesia Preprocedure Evaluation (Signed)
Anesthesia Evaluation  Patient identified by MRN, date of birth, ID band Patient awake    Reviewed: Allergy & Precautions, H&P , NPO status , Patient's Chart, lab work & pertinent test results, reviewed documented beta blocker date and time   History of Anesthesia Complications Negative for: history of anesthetic complications  Airway Mallampati: II  TM Distance: >3 FB Neck ROM: full    Dental  (+) Dental Advidsory Given, Implants, Teeth Intact   Pulmonary neg shortness of breath, asthma , neg sleep apnea, neg recent URI, former smoker,    Pulmonary exam normal breath sounds clear to auscultation       Cardiovascular Exercise Tolerance: Good negative cardio ROS Normal cardiovascular exam Rhythm:regular Rate:Normal     Neuro/Psych negative neurological ROS  negative psych ROS   GI/Hepatic negative GI ROS, Neg liver ROS,   Endo/Other  negative endocrine ROS  Renal/GU negative Renal ROS  negative genitourinary   Musculoskeletal   Abdominal   Peds  Hematology negative hematology ROS (+)   Anesthesia Other Findings Past Medical History: No date: Asthma 12/31/2019: Laboratory confirmed diagnosis of COVID-19   Reproductive/Obstetrics negative OB ROS                             Anesthesia Physical Anesthesia Plan  ASA: II  Anesthesia Plan: General   Post-op Pain Management:    Induction: Intravenous  PONV Risk Score and Plan: 3 and Ondansetron, Dexamethasone, Midazolam, Promethazine and Treatment may vary due to age or medical condition  Airway Management Planned: Oral ETT  Additional Equipment:   Intra-op Plan:   Post-operative Plan: Extubation in OR  Informed Consent: I have reviewed the patients History and Physical, chart, labs and discussed the procedure including the risks, benefits and alternatives for the proposed anesthesia with the patient or authorized representative  who has indicated his/her understanding and acceptance.     Dental Advisory Given  Plan Discussed with: Anesthesiologist, CRNA and Surgeon  Anesthesia Plan Comments:         Anesthesia Quick Evaluation

## 2020-07-30 NOTE — Discharge Instructions (Signed)
Discharge instructions after   total laparoscopic hysterectomy   For the next three days, take ibuprofen and acetaminophen on a schedule, every 8 hours. You can take them together or you can intersperse them, and take one every four hours. I also gave you gabapentin for nighttime, to help you sleep and also to control pain. Take gabapentin medicines at night for at least the next 3 nights. You also have a narcotic, oxycodone, to take as needed if the above medicines don't help.  Postop constipation is a major cause of pain. Stay well hydrated, walk as you tolerate, and take over the counter senna as well as stool softeners if you need them.    Signs and Symptoms to Report Call our office at [phone] if you have any of the following.  . Fever over 100.4 degrees or higher . Severe stomach pain not relieved with pain medications . Bright red bleeding that's heavier than a period that does not slow with rest . To go the bathroom a lot (frequency), you can't hold your urine (urgency), or it hurts when you empty your bladder (urinate) . Chest pain . Shortness of breath . Pain in the calves of your legs . Severe nausea and vomiting not relieved with anti-nausea medications . Signs of infection around your wounds, such as redness, hot to touch, swelling, green/yellow drainage (like pus), bad smelling discharge . Any concerns  What You Can Expect after Surgery . You may see some pink tinged, bloody fluid and bruising around the wound. This is normal. . You may notice shoulder and neck pain. This is caused by the gas used during surgery to expand your abdomen so your surgeon could get to the uterus easier. . You may have a sore throat because of the tube in your mouth during general anesthesia. This will go away in 2 to 3 days. . You may have some stomach cramps. . You may notice spotting on your panties. . You may have pain around the incision sites.   Activities after Your  Discharge Follow these guidelines to help speed your recovery at home: . Do the coughing and deep breathing as you did in the hospital for 2 weeks. Use the small blue breathing device, called the incentive spirometer for 2 weeks. . Don't drive if you are in pain or taking narcotic pain medicine. You may drive when you can safely slam on the brakes, turn the wheel forcefully, and rotate your torso comfortably. This is typically 1-2 weeks. Practice in a parking lot or side street prior to attempting to drive regularly.  . Ask others to help with household chores for 4 weeks. . Do not lift anything heavier that 10 pounds for 4-6 weeks. This includes pets, children, and groceries. . Don't do strenuous activities, exercises, or sports like vacuuming, tennis, squash, etc. until your doctor says it is safe to do so. ---Maintain pelvic rest for 8 weeks. This means nothing in the vagina or rectum at all (no douching, tampons, intercourse) for 8 weeks.  . Walk as you feel able. Rest often since it may take two or three weeks for your energy level to return to normal.  . You may climb stairs . Avoid constipation:   -Eat fruits, vegetables, and whole grains. Eat small meals as your appetite will take time to return to normal.   -Drink 6 to 8 glasses of water each day unless your doctor has told you to limit your fluids.   -Use a laxative   or stool softener as needed if constipation becomes a problem. You may take Miralax, metamucil, Citrucil, Colace, Senekot, FiberCon, etc. If this does not relieve the constipation, try two tablespoons of Milk Of Magnesia every 8 hours until your bowels move.  . You may shower. Gently wash the wounds with a mild soap and water. Pat dry. . Do not get in a hot tub, swimming pool, etc. for 6 weeks. . Do not use lotions, oils, powders on the wounds. . Do not douche, use tampons, or have sex until your doctor says it is okay. . Take your pain medicine when you need it. The medicine  may not work as well if the pain is bad.  Take the medicines you were taking before surgery. Other medications you will need are pain medications and possibly constipation and nausea medications (Zofran).    AMBULATORY SURGERY  DISCHARGE INSTRUCTIONS   1) The drugs that you were given will stay in your system until tomorrow so for the next 24 hours you should not:  A) Drive an automobile B) Make any legal decisions C) Drink any alcoholic beverage   2) You may resume regular meals tomorrow.  Today it is better to start with liquids and gradually work up to solid foods.  You may eat anything you prefer, but it is better to start with liquids, then soup and crackers, and gradually work up to solid foods.   3) Please notify your doctor immediately if you have any unusual bleeding, trouble breathing, redness and pain at the surgery site, drainage, fever, or pain not relieved by medication.    4) Additional Instructions:        Please contact your physician with any problems or Same Day Surgery at [phone], Monday through Friday 6 am to 4 pm, or [place] [facility] [place] [facility] number at [phone]. 

## 2020-07-30 NOTE — Interval H&P Note (Signed)
History and Physical Interval Note:  07/30/2020 11:07 AM  Nicole Stanley  has presented today for surgery, with the diagnosis of fibroid uterus.  The various methods of treatment have been discussed with the patient and family. After consideration of risks, benefits and other options for treatment, the patient has consented to  Procedure(s): TOTAL LAPAROSCOPIC HYSTERECTOMY WITH BILATERAL SALPINGO OOPHORECTOMY (Bilateral) as a surgical intervention.  The patient's history has been reviewed, patient examined, no change in status, stable for surgery.  I have reviewed the patient's chart and labs.  Questions were answered to the patient's satisfaction.     Benjaman Kindler

## 2020-07-30 NOTE — Op Note (Signed)
Nicole Stanley PROCEDURE DATE: 07/30/2020  PREOPERATIVE DIAGNOSIS: Fibroid uterus, AUB POSTOPERATIVE DIAGNOSIS: The same PROCEDURE:  TOTAL LAPAROSCOPIC HYSTERECTOMY WITH BILATERAL SALPINGO OOPHORECTOMY:  CYSTOSCOPY:   SURGEON:  Dr. Benjaman Kindler, MD ASSISTANT: Dr. Larey Days, MD  Anesthesiologist:  Anesthesiologist: Martha Clan, MD; Gunnar Fusi, MD CRNA: Kelton Pillar, CRNA; Nelda Marseille, CRNA; Philbert Riser, CRNA  INDICATIONS: 59 y.o. F  here for definitive surgical management secondary to the indications listed under preoperative diagnoses; please see preoperative note for further details.  Risks of surgery were discussed with the patient including but not limited to: bleeding which may require transfusion or reoperation; infection which may require antibiotics; injury to bowel, bladder, ureters or other surrounding organs; need for additional procedures; thromboembolic phenomenon, incisional problems and other postoperative/anesthesia complications. Written informed consent was obtained.    FINDINGS:  Enlarged fibroid uterus, normal ovaries and tubes bilaterally. Several large fundal calcified fibroids. The right ureter was not visualized prior to taking the IP, and therefore the medial aspect of the right broad was developed and a cysto performed after surgery. She also bled from all incisions, but minimally. We used arista intraperitoneally, and the cuff was closed from below.   ANESTHESIA:    General INTRAVENOUS FLUIDS:1300  ml ESTIMATED BLOOD LOSS:25 ml URINE OUTPUT: 600 ml  SPECIMENS: Uterus, cervix, bilateral fallopian tubes and bilateral ovaries COMPLICATIONS: None immediate  PROCEDURE IN DETAIL:  The patient received prophalactic intravenous antibiotics and had sequential compression devices applied to her lower extremities while in the preoperative area.  She was then taken to the operating room where general anesthesia was administered and  was found to be adequate.  She was placed in the dorsal lithotomy position, and was prepped and draped in a sterile manner.  A formal time out was performed with all team members present and in agreement.  A V-care uterine manipulator was placed at this time.  A Foley catheter was inserted into her bladder and attached to constant drainage. Attention was turned to the abdomen where an umbilical incision was made with the scalpel.  The Optiview 5-mm trocar and sleeve were then advanced without difficulty with the laparoscope under direct visualization into the abdomen.  The abdomen was then insufflated with carbon dioxide gas and adequate pneumoperitoneum was obtained.  A survey of the patient's pelvis and abdomen revealed the findings above.  Bilateral lower quadrant ports (5 mm on the right and 5 mm on the left) were then placed under direct visualization.  The pelvis was then carefully examined. The medial leaflet of the broad was developed on the right. The left ureter was visualized.   Attention was turned to the IP ligaments. These were fulgurated and ligated, freeing the ovaries from the pelvic sidewall. The broad ligament was transected and the anterior and posterior leaflets of the broad opened. The uterine artery was then skeletonized and a bladder flap was created.  The ureters were noted to be safely away from the area of dissection.  The bladder was then bluntly dissected off the lower uterine segment.  The anterior fibroid made this difficult and care was taken to avoid damage to the bladder at the done  At this point, attention was turned to the uterine vessels, which were clamped and ligated using the Ligasure.  Good hemostasis was noted overall.  The uterosacral and cardinal ligaments were clamped, cut and ligated bilaterally .  Attention was then turned to the cervicovaginal junction, and the bipolar scissors were used to transect the  cervix from the surrounding vagina using the ring of the  V-care as a guide. This was done circumferentially allowing total hysterectomy.  The uterus was then removed from the vagina and the vaginal cuff incision was then closed with running V-loc.  Overall excellent hemostasis was noted.    Attention was returned to the abdomen.The ureters were reexamined bilaterally and were pulsating normally. The abdominal pressure was reduced and hemostasis was confirmed.   Intravenous floruoceine was administered, and cystoscopy showed bilateral ureteral jets.  No stitches were visualized in the bladder during cystoscopy.  The 84mm port fascia was closed with a vertical mattress with 0-Vicryl, using the cone closure system. All trocars were removed under direct visualization, and the abdomen was desufflated.  The fascial incision of the umbilicus was closed with a 0 Vicryl figure of eight stitch.  All skin incisions were closed with 4-0 Vicryl subcuticular stitches and Dermabond. The patient tolerated the procedures well.  All instruments, needles, and sponge counts were correct x 2. The patient was taken to the recovery room awake, extubated and in stable condition.

## 2020-07-30 NOTE — Anesthesia Postprocedure Evaluation (Signed)
Anesthesia Post Note  Patient: Nicole Stanley  Procedure(s) Performed: TOTAL LAPAROSCOPIC HYSTERECTOMY WITH BILATERAL SALPINGO OOPHORECTOMY (Bilateral ) CYSTOSCOPY  Patient location during evaluation: PACU Anesthesia Type: General Level of consciousness: awake and alert Pain management: pain level controlled Vital Signs Assessment: post-procedure vital signs reviewed and stable Respiratory status: spontaneous breathing and respiratory function stable Cardiovascular status: stable Anesthetic complications: no   No complications documented.   Last Vitals:  Vitals:   07/30/20 0922 07/30/20 1443  BP: (!) 150/85 (!) 163/81  Pulse: 84 88  Resp: 20 (!) 23  Temp: 37.1 C (!) 36.3 C  SpO2: 99% 100%    Last Pain:  Vitals:   07/30/20 1443  TempSrc:   PainSc: Asleep                 Gisele Pack K

## 2020-07-30 NOTE — Anesthesia Procedure Notes (Signed)
Performed by: Shaylen Nephew E, CRNA       

## 2020-07-30 NOTE — Anesthesia Procedure Notes (Signed)
Procedure Name: Intubation Date/Time: 07/30/2020 11:26 AM Performed by: Philbert Riser, CRNA Pre-anesthesia Checklist: Patient identified, Emergency Drugs available, Suction available, Patient being monitored and Timeout performed Patient Re-evaluated:Patient Re-evaluated prior to induction Oxygen Delivery Method: Circle system utilized and Simple face mask Preoxygenation: Pre-oxygenation with 100% oxygen Induction Type: IV induction Ventilation: Mask ventilation without difficulty Laryngoscope Size: Mac and 3 Grade View: Grade I Tube type: Oral Tube size: 7.0 mm Number of attempts: 1 Airway Equipment and Method: Stylet Secured at: 20 cm Tube secured with: Tape Dental Injury: Teeth and Oropharynx as per pre-operative assessment

## 2020-07-30 NOTE — Transfer of Care (Signed)
Immediate Anesthesia Transfer of Care Note  Patient: Nicole Stanley  Procedure(s) Performed: TOTAL LAPAROSCOPIC HYSTERECTOMY WITH BILATERAL SALPINGO OOPHORECTOMY (Bilateral ) CYSTOSCOPY  Patient Location: PACU  Anesthesia Type:General  Level of Consciousness: awake and oriented  Airway & Oxygen Therapy: Patient Spontanous Breathing and Patient connected to face mask oxygen  Post-op Assessment: Report given to RN and Post -op Vital signs reviewed and stable  Post vital signs: Reviewed  Last Vitals:  Vitals Value Taken Time  BP 163/81 07/30/20 1443  Temp 36.3 C 07/30/20 1443  Pulse 83 07/30/20 1443  Resp 19 07/30/20 1443  SpO2 100 % 07/30/20 1443  Vitals shown include unvalidated device data.  Last Pain:  Vitals:   07/30/20 1443  TempSrc:   PainSc: Asleep         Complications: No complications documented.

## 2020-07-31 ENCOUNTER — Encounter: Payer: Self-pay | Admitting: Obstetrics and Gynecology

## 2020-08-02 LAB — SURGICAL PATHOLOGY

## 2020-10-22 ENCOUNTER — Other Ambulatory Visit: Payer: Self-pay | Admitting: Physician Assistant

## 2020-10-22 DIAGNOSIS — Z1231 Encounter for screening mammogram for malignant neoplasm of breast: Secondary | ICD-10-CM

## 2020-11-29 ENCOUNTER — Other Ambulatory Visit: Payer: Self-pay

## 2020-11-29 ENCOUNTER — Ambulatory Visit
Admission: RE | Admit: 2020-11-29 | Discharge: 2020-11-29 | Disposition: A | Discharge status: Home / Self Care | Payer: BC Managed Care – PPO | Source: Ambulatory Visit | Attending: Physician Assistant | Admitting: Physician Assistant

## 2020-11-29 DIAGNOSIS — Z1231 Encounter for screening mammogram for malignant neoplasm of breast: Secondary | ICD-10-CM

## 2021-10-29 ENCOUNTER — Other Ambulatory Visit: Payer: Self-pay | Admitting: Family Medicine

## 2021-10-29 DIAGNOSIS — Z1231 Encounter for screening mammogram for malignant neoplasm of breast: Secondary | ICD-10-CM

## 2022-01-20 ENCOUNTER — Ambulatory Visit
Admission: RE | Admit: 2022-01-20 | Discharge: 2022-01-20 | Disposition: A | Discharge status: Home / Self Care | Payer: BC Managed Care – PPO | Source: Ambulatory Visit | Attending: Family Medicine | Admitting: Family Medicine

## 2022-01-20 DIAGNOSIS — Z1231 Encounter for screening mammogram for malignant neoplasm of breast: Secondary | ICD-10-CM | POA: Diagnosis not present

## 2022-01-20 NOTE — Progress Notes (Signed)
? ?01/21/22 ?1:20 PM  ? ?Nicole Stanley ?[birth year] ?[id number] ? ?Referring provider:  ?Hortencia Pilar, MD ?[address], [address] ?[place],  [place] [address] ?Chief Complaint  ?Patient presents with  ? Hematuria  ? ? ? ?HPI: ?Nicole Stanley is a 61 y.o.female who presents today for further evaluation of microscopic hematuria.  ? ?She has a personal history pelvic pain and hysterectomy for which she is seeing gynecology. ? ?She was seen by her PCP Dr Weston BrassRodena Piety, on 09/18/2021, for urinary frequency, at time of visit she had microscopic blood. Urinalysis had 2+ blood but was otherwise unremarkable.  ? ?A CT urogram was ordered to further evaluate her hematuria at [facility], on 10/03/2021. It visualized no acute/ inflammatory findings of the abdomen or pelvis. No VT evidence for etiology of hematuria.  ? ?She has a recent urinalysis at [facility] that showed moderate blood, 10-50 RBCs, and  rare bacteria.  ? ?She reports today that she had 2 knee replacements last year and she had a urinalysis that had microscopic hematuria. She had increased urinary tract infection symptoms in December 2022, and she self treated. She remarks that she has been told about microscopic blood in her urine for the past year.  ? ?She has a history of smoking x1 pack per 2 days for 35 years; she stopped smoking in 2020. ?  ?PMH: ?Past Medical History:  ?Diagnosis Date  ? Asthma   ? Laboratory confirmed diagnosis of COVID-19 12/31/2019  ? ? ?Surgical History: ?Past Surgical History:  ?Procedure Laterality Date  ? ABDOMINAL HYSTERECTOMY  07/2020  ? CYSTOSCOPY  07/30/2020  ? Procedure: CYSTOSCOPY;  Surgeon: Benjaman Kindler, MD;  Location: [facility] ORS;  Service: Gynecology;;  ? KNEE ARTHROSCOPY Right   ? menicus  ? OOPHORECTOMY    ? TOTAL LAPAROSCOPIC HYSTERECTOMY WITH BILATERAL SALPINGO OOPHORECTOMY Bilateral 07/30/2020  ? Procedure: TOTAL LAPAROSCOPIC HYSTERECTOMY WITH BILATERAL SALPINGO OOPHORECTOMY;  Surgeon: Benjaman Kindler,  MD;  Location: [facility] ORS;  Service: Gynecology;  Laterality: Bilateral;  ? ? ?Home Medications:  ?Allergies as of 01/21/2022   ? ?   Reactions  ? Penicillins Anaphylaxis, Rash  ? Childhood reaction.  ? ?  ? ?  ?Medication List  ?  ? ?  ? Accurate as of January 21, 2022  1:20 PM. If you have any questions, ask your nurse or doctor.  ?  ?  ? ?  ? ?STOP taking these medications   ? ?docusate sodium 100 MG capsule ?Commonly known as: COLACE ?Stopped by: Hollice Espy, MD ?  ?gabapentin 800 MG tablet ?Commonly known as: Neurontin ?Stopped by: Hollice Espy, MD ?  ?ondansetron 4 MG disintegrating tablet ?Commonly known as: Zofran ODT ?Stopped by: Hollice Espy, MD ?  ?oxyCODONE 5 MG immediate release tablet ?Commonly known as: Oxy IR/ROXICODONE ?Stopped by: Hollice Espy, MD ?  ? ?  ? ?TAKE these medications   ? ?Advair Diskus 100-50 MCG/ACT Aepb ?Generic drug: fluticasone-salmeterol ?Inhale 1 puff into the lungs daily. ?  ?aspirin 81 MG EC tablet ?aspirin 81 mg tablet,delayed release ? TAKE 1 TABLET BY MOUTH TWICE A DAY ?  ?estradiol 1 MG tablet ?Commonly known as: ESTRACE ?Take 1 mg by mouth daily. ?  ?multivitamin with minerals Tabs tablet ?Take 1 tablet by mouth daily. Centrum Silver ?  ? ?  ? ? ?Allergies:  ?Allergies  ?Allergen Reactions  ? Penicillins Anaphylaxis and Rash  ?  Childhood reaction.  ? ? ?Family History: ?Family History  ?Problem Relation Age  of Onset  ? Breast cancer Neg Hx   ? ? ?Social History:  reports that she quit smoking about 10 years ago. Her smoking use included cigarettes. She has never used smokeless tobacco. She reports current alcohol use. She reports that she does not use drugs. ? ? ?Physical Exam: ?BP (!) 162/84   Pulse 73   Ht 5' 0.5" (1.537 m)   Wt 173 lb (78.5 kg)   BMI 33.23 kg/m?   ?Constitutional:  Alert and oriented, No acute distress. ?HEENT: [place] AT, moist mucus membranes.  Trachea midline, no masses. ?Cardiovascular: No clubbing, cyanosis, or edema. ?Respiratory: Normal  respiratory effort, no increased work of breathing. ?Skin: No rashes, bruises or suspicious lesions. ?Neurologic: Grossly intact, no focal deficits, moving all 4 extremities. ?Psychiatric: Normal mood and affect. ? ?Laboratory Data: ?Lab Results  ?Component Value Date  ? CREATININE 0.49 07/19/2020  ? ?Urinalysis ?11-30 RBCs and otherwise unremarkable  ? ?Pertinent Imaging: ?CT GU protocol inc CT dual abd and pelvis with and without contrast:  ? ?Indication:  Hematuria, microscopic, increased risk for urinary tract  malignancy, R31.29 Other microscopic hematuria  ? ?Technique:  Axial images of the abdomen and pelvis from the diaphragm  through the pubic symphysis after the uneventful IV administration of  ?contrast in venous and delayed phases. Unenhanced images were obtained.  Oral contrast was not administered. Coronal and sagittal reformats were  also obtained. Dose reduction was obtained with Automatic Exposure Control  ?(AEC) or, if AEC could not be utilized, by manual adjustment of the mA and/or kV according to patient size.    ? ?Comparison:  None.  ? ?Findings:  ? ?Abdomen:    ? ?Lung bases:  Lung bases are clear.  ?Visualized heart, pericardium and retrocrural space:  Normal.  ?Stomach/Distal esophagus:   Normal.  ?Bowel and mesentery:   No evidence of pathologic dilation, bowel wall  thickening or inflammatory changes in the adjacent fat. There is however  ?infiltration of the cecal wall, likely representative of history of  inflammatory bowel disease.  ?Liver:   No mass or cyst.  ?Portal vein/superior mesenteric vein:  Patent.  ?Gallbladder and bile ducts:  No intrahepatic or extrahepatic biliary duct  ?dilatation. The gallbladder is unremarkable.  ? ?Spleen:  No enlargement or parenchymal abnormalities.  ? ?Pancreas:  No masses or ductal dilation.  ? ?Adrenal Glands:  Normal size, no masses.  ? ?Kidneys:  Both kidneys are normal in appearance with no evidence of masses.  The collecting systems appear  normal with no evidence of hydronephrosis.  The ureters follow a normal course to the pelvis and are not dilated.  ? ?Pelvis:  ? ?Appendix:  Not visualized, no inflammatory change in right lower quadrant.  ?Bladder:  No masses, wall thickening or diverticula.  ?Uterus:  Status post hysterectomy.  ?Free fluid:  None.  ?Groin regions:  Normal.  ? ?Misc:  ? ?Lymphatics/nodes:  No lymphadenopathy.  ?Vasculature:  No aneurysms or other significant pathology  ?Subcutaneous soft tissues:  Normal.  ?Bones:  There are mild degenerative changes of thoracolumbar spine, most  ?prominent at L5-S1. There is grade 1 anterolisthesis of L4 on L5  ? ?IMPRESSION:      ?No acute/inflammatory findings of the abdomen or pelvis. No CT evidence for  ?etiology of hematuria.  ? ? ?Electronically Signed by:  Mare Loan, MD, [facility] Radiology  ?Electronically Signed on:  10/03/2021 3:40 PM ? ?Unable to personally review CT because it was done at outside facility.  ? ?  Assessment & Plan: ? ?Microscopic hematuria  ?- In light of recent positive urinalysis for hematuria will further evaluate this with a cystoscopy.  ?-Differential diagnosis was discussed, increased risk for bladder cancer given smoking history ?- We discussed this in detail she is agreeable with this plan  ? ? ? ?Conley Rolls as a scribe for Hollice Espy, MD.,have documented all relevant documentation on the behalf of Hollice Espy, MD,as directed by  Hollice Espy, MD while in the presence of Hollice Espy, MD. ? ?I have seen and examined the patient, labs/ imaging reviewed and discussed  management with Debroah Loop. I reviewed the PA's note and agree with the documented findings and plan of care. ? ? ?[facility] ?[address], [address] ?[place], [place] [address] ?[phone] ?

## 2022-01-21 ENCOUNTER — Ambulatory Visit: Payer: BC Managed Care – PPO | Admitting: Urology

## 2022-01-21 ENCOUNTER — Encounter: Payer: Self-pay | Admitting: Urology

## 2022-01-21 VITALS — BP 162/84 | HR 73 | Ht 60.5 in | Wt 173.0 lb

## 2022-01-21 DIAGNOSIS — R319 Hematuria, unspecified: Secondary | ICD-10-CM

## 2022-01-21 LAB — URINALYSIS, COMPLETE
Bilirubin, UA: NEGATIVE
Glucose, UA: NEGATIVE
Ketones, UA: NEGATIVE
Leukocytes,UA: NEGATIVE
Nitrite, UA: NEGATIVE
Protein,UA: NEGATIVE
Specific Gravity, UA: 1.02 (ref 1.005–1.030)
Urobilinogen, Ur: 0.2 mg/dL (ref 0.2–1.0)
pH, UA: 5.5 (ref 5.0–7.5)

## 2022-01-21 LAB — MICROSCOPIC EXAMINATION
Bacteria, UA: NONE SEEN
Epithelial Cells (non renal): NONE SEEN /hpf (ref 0–10)

## 2022-01-21 NOTE — Patient Instructions (Signed)

## 2022-01-29 NOTE — Progress Notes (Signed)
? ?  01/30/22 ? ?CC:  ?Chief Complaint  ?Patient presents with  ? Cysto  ? ? ? ?HPI: ?Nicole Stanley is a 61 y.o.female with a personal history of microscopic hematuria who presents today for a cystoscopy.  ? ?She has a personal history pelvic pain and hysterectomy for which she is seeing gynecology. ?  ?CTU on 10/03/2021 visualized no acute/ inflammatory findings of the abdomen or pelvis. No VT evidence for etiology of hematuria.  ?  ?She has a history of smoking x1 pack per 2 days for 35 years; she stopped smoking in 2020. ? ? ?Vitals:  ? 01/30/22 0907  ?BP: (!) 165/78  ?Pulse: 82  ?NED. A&Ox3.   ?No respiratory distress   ?Abd soft, NT, ND ?Normal external genitalia with patent urethral meatus ? ?Cystoscopy Procedure Note ? ?Patient identification was confirmed, informed consent was obtained, and patient was prepped using Betadine solution.  Lidocaine jelly was administered per urethral meatus.   ? ?Procedure: ?- Flexible cystoscope introduced, without any difficulty.   ?- Thorough search of the bladder revealed: ?   normal urethral meatus ?   normal urothelium ?   no stones ?   no ulcers  ?   no tumors ?   no urethral polyps ?   no trabeculation ? ?- Ureteral orifices were normal in position and appearance. ? ?Post-Procedure: ?- Patient tolerated the procedure well ? ?Assessment/ Plan: ? ?Microscopic hematuria  ?- Cystoscopy was unremarkable with NED  ?- CTU was reassuring  ?- Discussed that if she experiences any gross hematuria that she should follow-up with [place]  ? ?Plan for reevaluation with [facility] in 2 years unless hematuria progresses ? ? ?Conley Rolls as a scribe for Hollice Espy, MD.,have documented all relevant documentation on the behalf of Hollice Espy, MD,as directed by  Hollice Espy, MD while in the presence of Hollice Espy, MD. ? ?I have reviewed the above documentation for accuracy and completeness, and I agree with the above.  ? ?Hollice Espy, MD ? ?

## 2022-01-30 ENCOUNTER — Ambulatory Visit: Payer: BC Managed Care – PPO | Admitting: Urology

## 2022-01-30 VITALS — BP 165/78 | HR 82 | Ht 60.0 in | Wt 173.0 lb

## 2022-01-30 DIAGNOSIS — R319 Hematuria, unspecified: Secondary | ICD-10-CM | POA: Diagnosis not present

## 2022-01-30 LAB — URINALYSIS, COMPLETE
Bilirubin, UA: NEGATIVE
Glucose, UA: NEGATIVE
Ketones, UA: NEGATIVE
Leukocytes,UA: NEGATIVE
Nitrite, UA: NEGATIVE
Protein,UA: NEGATIVE
Specific Gravity, UA: 1.025 (ref 1.005–1.030)
Urobilinogen, Ur: 0.2 mg/dL (ref 0.2–1.0)
pH, UA: 5.5 (ref 5.0–7.5)

## 2022-01-30 LAB — MICROSCOPIC EXAMINATION

## 2022-07-11 ENCOUNTER — Other Ambulatory Visit: Payer: Self-pay | Admitting: Family Medicine

## 2022-07-11 DIAGNOSIS — Z87891 Personal history of nicotine dependence: Secondary | ICD-10-CM

## 2022-07-11 DIAGNOSIS — Z122 Encounter for screening for malignant neoplasm of respiratory organs: Secondary | ICD-10-CM

## 2022-07-29 ENCOUNTER — Ambulatory Visit: Payer: BC Managed Care – PPO

## 2022-08-20 ENCOUNTER — Ambulatory Visit
Admission: RE | Admit: 2022-08-20 | Discharge: 2022-08-20 | Disposition: A | Discharge status: Home / Self Care | Payer: BC Managed Care – PPO | Source: Ambulatory Visit | Attending: Family Medicine | Admitting: Family Medicine

## 2022-08-20 DIAGNOSIS — Z87891 Personal history of nicotine dependence: Secondary | ICD-10-CM

## 2022-08-20 DIAGNOSIS — Z122 Encounter for screening for malignant neoplasm of respiratory organs: Secondary | ICD-10-CM

## 2022-10-15 IMAGING — MG MM DIGITAL SCREENING BILAT W/ TOMO AND CAD
8 series · 8 of 24 positions shown · non-contrast
Comparison: Previous exam(s).

CLINICAL DATA: Screening.

EXAM:
DIGITAL SCREENING BILATERAL MAMMOGRAM WITH TOMOSYNTHESIS AND CAD
TECHNIQUE: Bilateral screening digital craniocaudal and mediolateral oblique
mammograms were obtained. Bilateral screening digital breast
tomosynthesis was performed. The images were evaluated with
computer-aided detection.

[L MLO synth-2D]
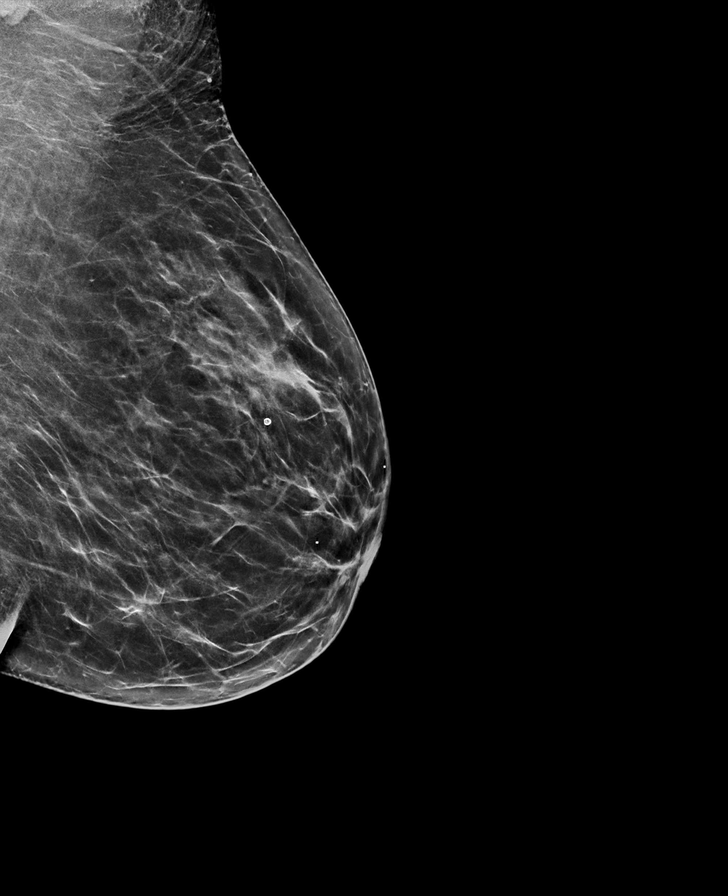

[L CC synth-2D]
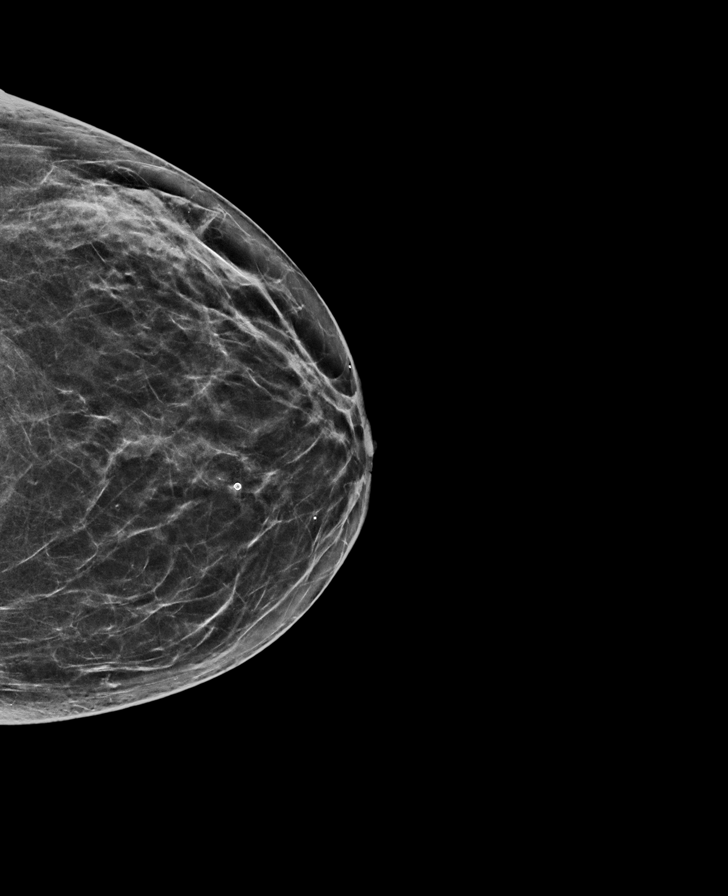

[R MLO synth-2D]
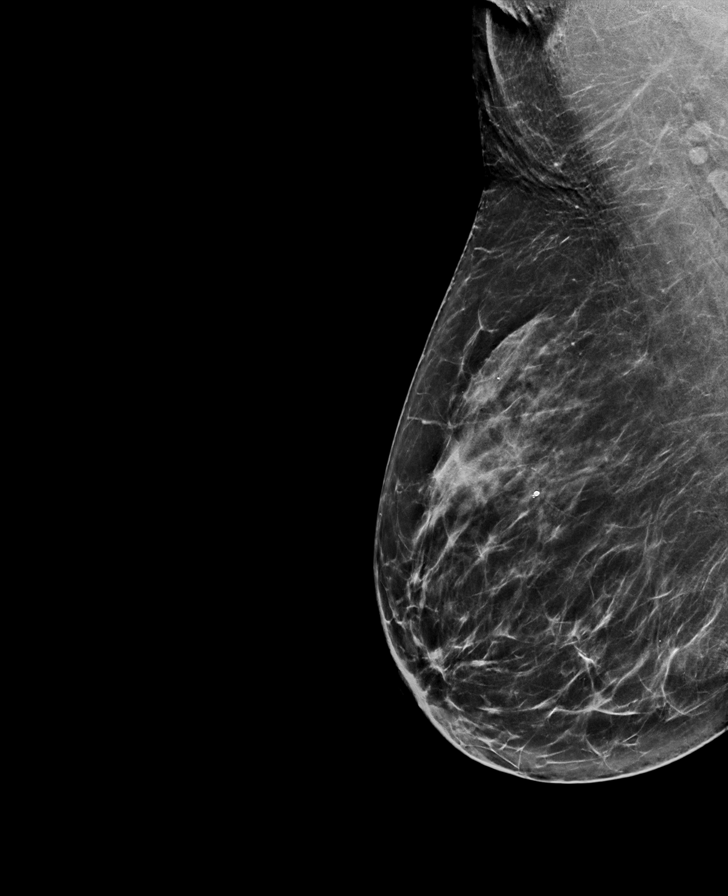

[R CC synth-2D]
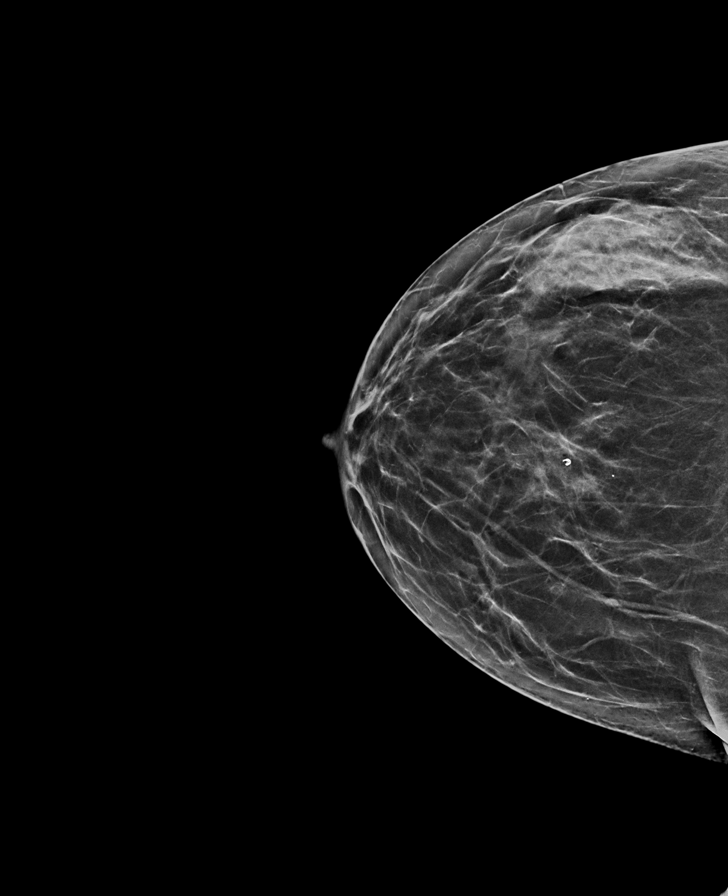

[L CC tomo · tomo slice 32/63.0]
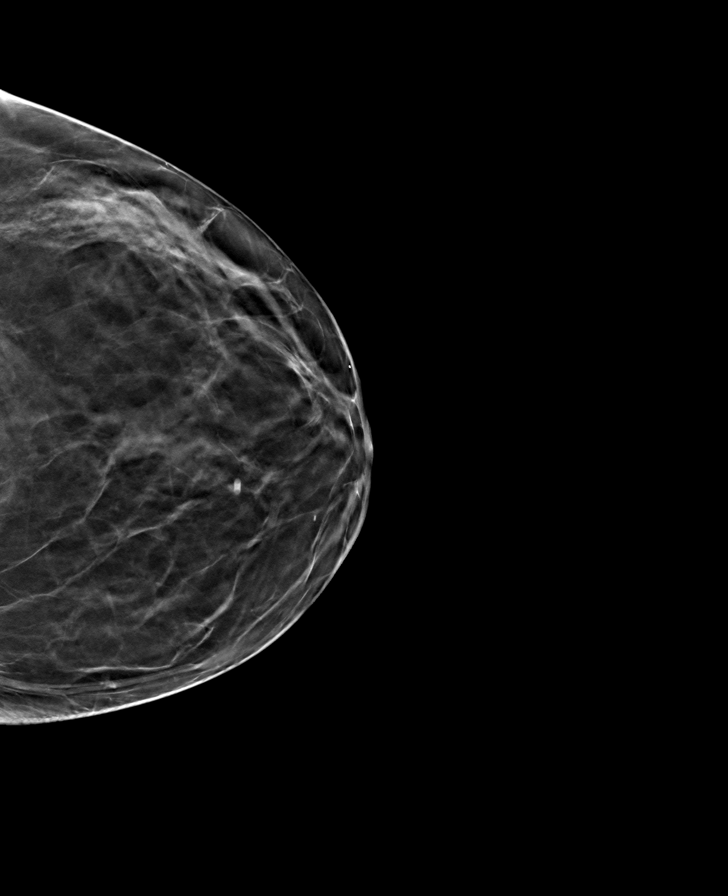

[R CC tomo · tomo slice 31/61.0]
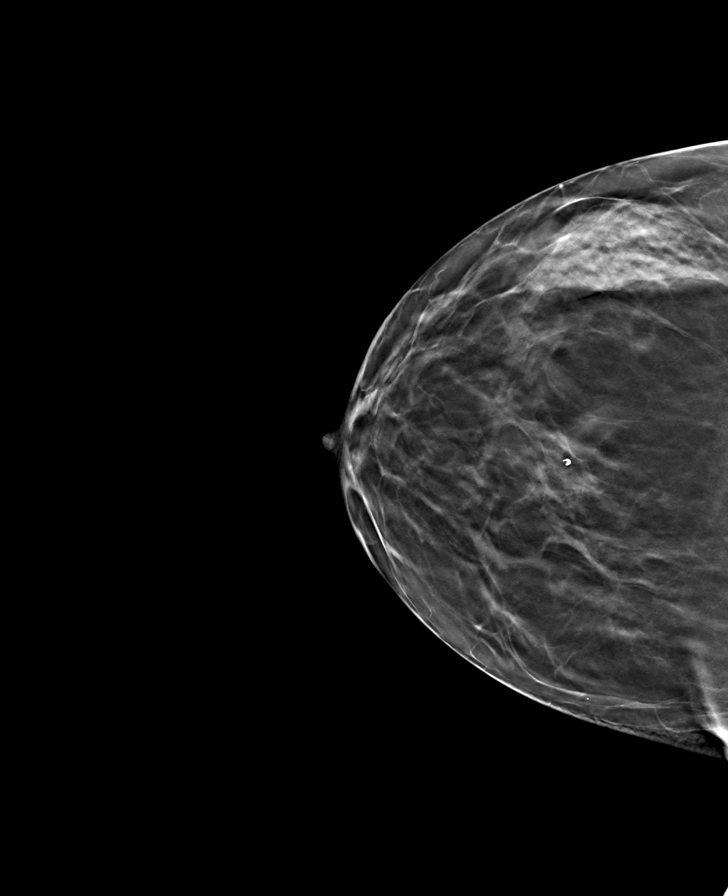

[L MLO tomo · tomo slice 34/67.0]
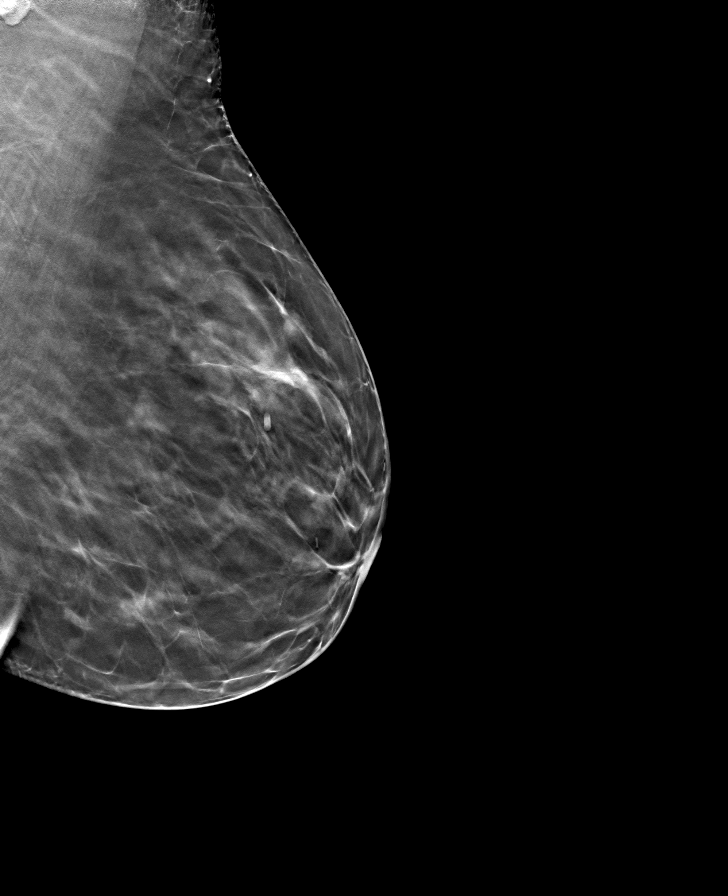

[R MLO tomo · tomo slice 37/74.0]
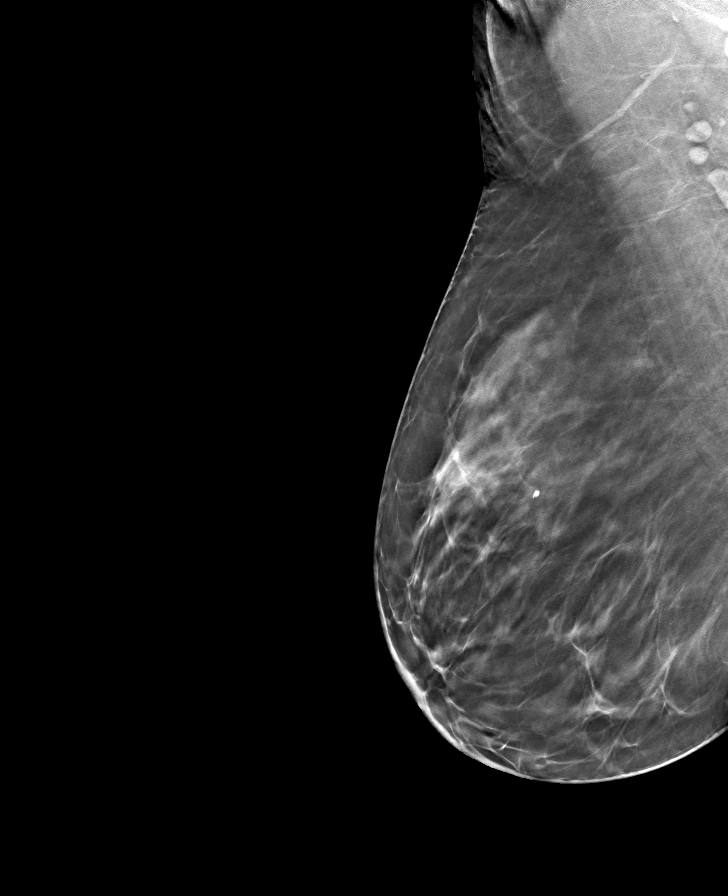

[8 of 24 positions shown; findings below may reference images not displayed]

ACR Breast Density Category c: The breast tissue is heterogeneously
dense, which may obscure small masses.
FINDINGS: There are no findings suspicious for malignancy.
IMPRESSION: No mammographic evidence of malignancy. A result letter of this
screening mammogram will be mailed directly to the patient.

RECOMMENDATION:
Screening mammogram in one year. (Code:Q3-W-BC3)

BI-RADS CATEGORY  1: Negative.

## 2022-12-23 ENCOUNTER — Other Ambulatory Visit: Payer: Self-pay | Admitting: Family Medicine

## 2022-12-23 DIAGNOSIS — Z1231 Encounter for screening mammogram for malignant neoplasm of breast: Secondary | ICD-10-CM

## 2023-01-22 ENCOUNTER — Ambulatory Visit
Admission: RE | Admit: 2023-01-22 | Discharge: 2023-01-22 | Disposition: A | Discharge status: Home / Self Care | Payer: BC Managed Care – PPO | Source: Ambulatory Visit | Attending: Family Medicine | Admitting: Family Medicine

## 2023-01-22 DIAGNOSIS — Z1231 Encounter for screening mammogram for malignant neoplasm of breast: Secondary | ICD-10-CM | POA: Diagnosis present

## 2023-04-08 ENCOUNTER — Ambulatory Visit
Admission: RE | Admit: 2023-04-08 | Discharge: 2023-04-08 | Disposition: A | Discharge status: Home / Self Care | Payer: BC Managed Care – PPO | Source: Ambulatory Visit | Attending: Emergency Medicine | Admitting: Emergency Medicine

## 2023-04-08 ENCOUNTER — Ambulatory Visit
Admission: EM | Admit: 2023-04-08 | Discharge: 2023-04-08 | Disposition: A | Discharge status: Home / Self Care | Payer: BC Managed Care – PPO

## 2023-04-08 ENCOUNTER — Encounter: Payer: Self-pay | Admitting: Emergency Medicine

## 2023-04-08 ENCOUNTER — Other Ambulatory Visit: Payer: Self-pay

## 2023-04-08 ENCOUNTER — Emergency Department
Admission: EM | Admit: 2023-04-08 | Discharge: 2023-04-08 | Disposition: A | Discharge status: Home / Self Care | Payer: BC Managed Care – PPO | Attending: Emergency Medicine | Admitting: Emergency Medicine

## 2023-04-08 DIAGNOSIS — M79662 Pain in left lower leg: Secondary | ICD-10-CM

## 2023-04-08 DIAGNOSIS — M25562 Pain in left knee: Secondary | ICD-10-CM | POA: Diagnosis present

## 2023-04-08 DIAGNOSIS — R600 Localized edema: Secondary | ICD-10-CM | POA: Diagnosis not present

## 2023-04-08 DIAGNOSIS — M79605 Pain in left leg: Secondary | ICD-10-CM

## 2023-04-08 DIAGNOSIS — M7122 Synovial cyst of popliteal space [Baker], left knee: Secondary | ICD-10-CM | POA: Diagnosis not present

## 2023-04-08 MED ORDER — MELOXICAM 15 MG PO TABS: 15.0000 mg | ORAL_TABLET | Freq: Every day | ORAL | 1 refills | Status: AC

## 2023-04-08 NOTE — Discharge Instructions (Addendum)
Go to [facility] for the ultrasound of your leg.  I will call you with the results.

## 2023-04-08 NOTE — ED Triage Notes (Signed)
Swelling started last night.  Patient reports pain in calf muscle for 1 - 1 1/2 weeks. Described pain and tightness in left calf of leg.  Tightness has continued, pain has improved, and swelling has started. Patient states swelling goes to area above ankle  Has used Aspercreme, tylenol.

## 2023-04-08 NOTE — ED Notes (Signed)
Spoke to [place] at [phone].  Patient is to be at [facility] at [facility] by 1:30 pm today

## 2023-04-08 NOTE — ED Provider Notes (Signed)
Nicole Stanley    CSN: [id number] Arrival date & time: 04/08/23  1020      History   Chief Complaint Chief Complaint  Patient presents with   Ankle Pain    Foot and Ankle sweeling bad! - Entered by patient    HPI Nicole Stanley is a 62 y.o. female.  Patient presents with swelling of her left lower leg since yesterday.  She has had calf pain for 1 week which she describes as tightness.  She has been treating the discomfort with Tylenol and Aspercreme.  Once the swelling started yesterday, she became concerned for possible blood clot.  She denies falls or injury.  She denies chest pain, shortness of breath, numbness, weakness, paresthesias, wounds, numbness, bruising, or other symptoms.  Her medical history includes asthma, knee surgery.  She is on estradiol.  The history is provided by the patient and medical records.    Past Medical History:  Diagnosis Date   Asthma    Laboratory confirmed diagnosis of COVID-19 12/31/2019    There are no problems to display for this patient.   Past Surgical History:  Procedure Laterality Date   ABDOMINAL HYSTERECTOMY  07/2020   CYSTOSCOPY  07/30/2020   Procedure: CYSTOSCOPY;  Surgeon: Christeen Douglas, MD;  Location: [facility] ORS;  Service: Gynecology;;   KNEE ARTHROSCOPY Right    menicus   OOPHORECTOMY     TOTAL LAPAROSCOPIC HYSTERECTOMY WITH BILATERAL SALPINGO OOPHORECTOMY Bilateral 07/30/2020   Procedure: TOTAL LAPAROSCOPIC HYSTERECTOMY WITH BILATERAL SALPINGO OOPHORECTOMY;  Surgeon: Christeen Douglas, MD;  Location: [facility] ORS;  Service: Gynecology;  Laterality: Bilateral;    OB History   No obstetric history on file.      Home Medications    Prior to Admission medications   Medication Sig Start Date End Date Taking? Authorizing Provider  ADVAIR DISKUS 100-50 MCG/DOSE AEPB Inhale 1 puff into the lungs daily. 05/15/20   [provider]  estradiol (ESTRACE) 1 MG tablet Take 1 mg by mouth daily. 07/16/20    [provider]  metFORMIN (GLUCOPHAGE-XR) 500 MG 24 hr tablet SMARTSIG:1 Tablet(s) By Mouth Every Evening    [provider]  Multiple Vitamin (MULTIVITAMIN WITH MINERALS) TABS tablet Take 1 tablet by mouth daily. Centrum Silver    [provider]    Family History Family History  Problem Relation Age of Onset   Breast cancer Neg Hx     Social History Social History   Tobacco Use   Smoking status: Former    Types: Cigarettes    Quit date: 07/17/2011    Years since quitting: 11.7   Smokeless tobacco: Never  Vaping Use   Vaping Use: Never used  Substance Use Topics   Alcohol use: Yes    Comment: rarely   Drug use: Never     Allergies   Penicillins   Review of Systems Review of Systems  Constitutional:  Negative for chills and fever.  Respiratory:  Negative for cough and shortness of breath.   Cardiovascular:  Positive for leg swelling. Negative for chest pain and palpitations.  Musculoskeletal:  Positive for myalgias. Negative for gait problem.  Skin:  Negative for color change, rash and wound.  Neurological:  Negative for weakness and numbness.     Physical Exam Triage Vital Signs ED Triage Vitals [04/08/23 1120]  Enc Vitals Group     BP      Pulse Rate 76     Resp 18     Temp 98.1 F (36.7  C)     Temp src      SpO2 96 %     Weight      Height      Head Circumference      Peak Flow      Pain Score      Pain Loc      Pain Edu?      Excl. in GC?    No data found.  Updated Vital Signs BP 121/80 (BP Location: Right Arm) Comment (BP Location): large cuff  Pulse 76   Temp 98.1 F (36.7 C)   Resp 18   SpO2 96%   Visual Acuity Right Eye Distance:   Left Eye Distance:   Bilateral Distance:    Right Eye Near:   Left Eye Near:    Bilateral Near:     Physical Exam Vitals and nursing note reviewed.  Constitutional:      General: She is not in acute distress.    Appearance: She is well-developed.  HENT:      Mouth/Throat:     Mouth: Mucous membranes are moist.  Cardiovascular:     Rate and Rhythm: Normal rate and regular rhythm.     Heart sounds: Normal heart sounds.  Pulmonary:     Effort: Pulmonary effort is normal. No respiratory distress.     Breath sounds: Normal breath sounds.  Musculoskeletal:        General: Swelling present. No tenderness or deformity. Normal range of motion.     Cervical back: Neck supple.     Comments: LLE 2+ edema from calf to ankle.  Nontender to palpation.  No wounds, erythema, ecchymosis.  FROM, sensation intact, 2+ pedal pulse.  Homan's negative.  Skin:    General: Skin is warm and dry.     Capillary Refill: Capillary refill takes less than 2 seconds.     Findings: No bruising, erythema, lesion or rash.  Neurological:     General: No focal deficit present.     Mental Status: She is alert and oriented to person, place, and time.     Sensory: No sensory deficit.     Motor: No weakness.     Gait: Gait normal.  Psychiatric:        Mood and Affect: Mood normal.        Behavior: Behavior normal.      UC Treatments / Results  Labs (all labs ordered are listed, but only abnormal results are displayed) Labs Reviewed - No data to display  EKG   Radiology US Venous Img Lower Unilateral Left (DVT)  Result Date: 04/08/2023 CLINICAL DATA:  Left lower extremity pain and edema. Evaluate for DVT. EXAM: LEFT LOWER EXTREMITY VENOUS DOPPLER ULTRASOUND TECHNIQUE: Gray-scale sonography with graded compression, as well as color Doppler and duplex ultrasound were performed to evaluate the lower extremity deep venous systems from the level of the common femoral vein and including the common femoral, femoral, profunda femoral, popliteal and calf veins including the posterior tibial, peroneal and gastrocnemius veins when visible. The superficial great saphenous vein was also interrogated. Spectral Doppler was utilized to evaluate flow at rest and with distal augmentation  maneuvers in the common femoral, femoral and popliteal veins. COMPARISON:  None Available. FINDINGS: Contralateral Common Femoral Vein: Respiratory phasicity is normal and symmetric with the symptomatic side. No evidence of thrombus. Normal compressibility. Common Femoral Vein: No evidence of thrombus. Normal compressibility, respiratory phasicity and response to augmentation. Saphenofemoral Junction: No evidence of thrombus. Normal compressibility and  flow on color Doppler imaging. Profunda Femoral Vein: No evidence of thrombus. Normal compressibility and flow on color Doppler imaging. Femoral Vein: No evidence of thrombus. Normal compressibility, respiratory phasicity and response to augmentation. Popliteal Vein: No evidence of thrombus. Normal compressibility, respiratory phasicity and response to augmentation. Calf Veins: No evidence of thrombus. Normal compressibility and flow on color Doppler imaging. Superficial Great Saphenous Vein: No evidence of thrombus. Normal compressibility. Other Findings: There is a 14.7 x 1.6 x 3.4 cm fluid collection within the left calf , nonspecific though favored to represent a dissecting Baker's cyst. IMPRESSION: 1. No evidence of DVT within the left lower extremity. 2. Approximately 14.7 cm fluid collection within the left calf, nonspecific though potentially representative of a dissecting Baker's cyst. Electronically Signed   By: Simonne Come M.D.   On: 04/08/2023 14:33    Procedures Procedures (including critical care time)  Medications Ordered in UC Medications - No data to display  Initial Impression / Assessment and Plan / UC Course  I have reviewed the triage vital signs and the nursing notes.  Pertinent labs & imaging results that were available during my care of the patient were reviewed by me and considered in my medical decision making (see chart for details).    LLE edema, left calf pain.  Discussed with patient the limitations of evaluation of her  symptoms in an urgent care setting.  She declines transfer to the ED.  She has no chest pain or shortness of breath.  Sending her for stat outpatient DVT ultrasound.  Ultrasound of left lower leg negative for DVT but shows 14.7 cm fluid collection within the left calf which may be a ruptured Baker's cyst.  Given the pressure she feels in her lower leg, her left lower leg edema, and the fluid collection seen on ultrasound, I feel that she needs a higher level of care.  I discussed the ultrasound findings with her via telephone and recommended that she go to the ED for evaluation.  She is agreeable to this.    Final Clinical Impressions(s) / UC Diagnoses   Final diagnoses:  Edema of left lower extremity  Pain of left calf     Discharge Instructions      Go to [facility] for the ultrasound of your leg.  I will call you with the results.         ED Prescriptions   None    PDMP not reviewed this encounter.   Mickie Bail, NP 04/08/23 [phone]

## 2023-04-08 NOTE — Discharge Instructions (Signed)
Take Meloxicam once daily for pain and inflammation.  Keep leg elevated at home. Apply ice for discomfort. Please follow-up with Dr. Joice Lofts as needed.

## 2023-04-08 NOTE — ED Provider Notes (Signed)
[facility] Provider Note  Patient Contact: 4:34 PM (approximate)   History   Leg Pain (LEFT calf pain x approx. 1 week; US shows "1. No evidence of DVT within the left lower extremity. 2. Approximately 14.7 cm fluid collection within the left calf, nonspecific though potentially representative of a dissecting Baker's cyst."; Patient does have swelling to LEFT foot and complains of tightness in the extremity)   HPI  Nicole Stanley is a 62 y.o. female presents to the emergency department with concern for left posterior knee pain and left calf pain.  Patient had an outpatient ultrasound today which indicated a large Baker's cyst that was possibly dissecting.  Patient has had some swelling along the left ankle as well.  No fever or chills.  No prior history of DVT or PE.      Physical Exam   Triage Vital Signs: ED Triage Vitals  Enc Vitals Group     BP 04/08/23 1554 (!) 166/91     Pulse Rate 04/08/23 1554 86     Resp 04/08/23 1554 19     Temp 04/08/23 1554 98.4 F (36.9 C)     Temp Source 04/08/23 1554 Oral     SpO2 04/08/23 1554 96 %     Weight 04/08/23 1550 172 lb (78 kg)     Height 04/08/23 1550 5' (1.524 m)     Head Circumference --      Peak Flow --      Pain Score 04/08/23 1549 1     Pain Loc --      Pain Edu? --      Excl. in GC? --     Most recent vital signs: Vitals:   04/08/23 1554  BP: (!) 166/91  Pulse: 86  Resp: 19  Temp: 98.4 F (36.9 C)  SpO2: 96%     General: Alert and in no acute distress. Eyes:  PERRL. EOMI. Head: No acute traumatic findings ENT:      Nose: No congestion/rhinnorhea.      Mouth/Throat: Mucous membranes are moist.  Neck: No stridor. No cervical spine tenderness to palpation. Cardiovascular:  Good peripheral perfusion Respiratory: Normal respiratory effort without tachypnea or retractions. Lungs CTAB. Good air entry to the bases with no decreased or absent breath sounds. Gastrointestinal: Bowel  sounds 4 quadrants. Soft and nontender to palpation. No guarding or rigidity. No palpable masses. No distention. No CVA tenderness. Musculoskeletal: Full range of motion to all extremities.  Palpable dorsalis pedis pulse, left.  Capillary refill less than 2 seconds on the left. Neurologic:  No gross focal neurologic deficits are appreciated.  Skin: Patient has edema of left lower extremity which extends from left calf to left ankle.    ED Results / Procedures / Treatments   Labs (all labs ordered are listed, but only abnormal results are displayed) Labs Reviewed - No data to display     RADIOLOGY  I personally viewed and evaluated these images as part of my medical decision making, as well as reviewing the written report by the radiologist.  ED Provider Interpretation: Patient has 14.7 cm left-sided dissecting Baker's cyst.   PROCEDURES:  Critical Care performed: No  Procedures   MEDICATIONS ORDERED IN ED: Medications - No data to display   IMPRESSION / MDM / ASSESSMENT AND PLAN / ED COURSE  I reviewed the triage vital signs and the nursing notes.  Assessment and plan Leg pain 62 year old female presents to the emergency department with left posterior calf pain that extends to the ankle.  Dedicated ultrasound indicates a 14.7 cm left-sided dissecting Baker's cyst.  I did review findings with Dr. Joice Lofts who recommended cam boot.  I prescribed patient meloxicam for home use and recommended rest, ice, compression and elevation.  Return precautions were given to return with new or worsening symptoms.      FINAL CLINICAL IMPRESSION(S) / ED DIAGNOSES   Final diagnoses:  Left leg pain  Synovial cyst of left popliteal space     Rx / DC Orders   ED Discharge Orders          Ordered    meloxicam (MOBIC) 15 MG tablet  Daily        04/08/23 1632             Note:  This document was prepared using Dragon voice recognition software  and may include unintentional dictation errors.   Pia Mau [address], PA-C 04/08/23 1636    Chesley Noon, MD 04/08/23 [phone]

## 2023-04-16 ENCOUNTER — Ambulatory Visit: Payer: BC Managed Care – PPO

## 2023-04-16 ENCOUNTER — Ambulatory Visit
Admission: EM | Admit: 2023-04-16 | Discharge: 2023-04-16 | Disposition: A | Discharge status: Home / Self Care | Payer: BC Managed Care – PPO | Attending: Emergency Medicine | Admitting: Emergency Medicine

## 2023-04-16 ENCOUNTER — Ambulatory Visit (INDEPENDENT_AMBULATORY_CARE_PROVIDER_SITE_OTHER): Payer: BC Managed Care – PPO

## 2023-04-16 DIAGNOSIS — M25472 Effusion, left ankle: Secondary | ICD-10-CM | POA: Diagnosis not present

## 2023-04-16 NOTE — ED Provider Notes (Signed)
[facility]    CSN: [id number] Arrival date & time: 04/16/23  1626      History   Chief Complaint Chief Complaint  Patient presents with   Foot Pain    HPI Nicole Stanley is a 62 y.o. female.   Patient presents for evaluation of left foot and ankle swelling and pain present for 7 days.  Approximately 2 weeks ago she was experiencing left leg pain and was evaluated in urgent care in the emergency department, deemed to have a Baker's cyst that ruptured.  Prescribed meloxicam for 7 days, endorses some improvement with swelling but did not fully resolve.  Swelling significantly worsened today, discomfort when wearing sandals.  Full range of motion and is able to bear weight.  Works at [employer] job, did not show an activity, new injury or trauma.  Denies numbness or tingling.   Past Medical History:  Diagnosis Date   Asthma    Laboratory confirmed diagnosis of COVID-19 12/31/2019    There are no problems to display for this patient.   Past Surgical History:  Procedure Laterality Date   ABDOMINAL HYSTERECTOMY  07/2020   CYSTOSCOPY  07/30/2020   Procedure: CYSTOSCOPY;  Surgeon: Christeen Douglas, MD;  Location: [facility] ORS;  Service: Gynecology;;   KNEE ARTHROSCOPY Right    menicus   OOPHORECTOMY     TOTAL LAPAROSCOPIC HYSTERECTOMY WITH BILATERAL SALPINGO OOPHORECTOMY Bilateral 07/30/2020   Procedure: TOTAL LAPAROSCOPIC HYSTERECTOMY WITH BILATERAL SALPINGO OOPHORECTOMY;  Surgeon: Christeen Douglas, MD;  Location: [facility] ORS;  Service: Gynecology;  Laterality: Bilateral;    OB History   No obstetric history on file.      Home Medications    Prior to Admission medications   Medication Sig Start Date End Date Taking? Authorizing Provider  ADVAIR DISKUS 100-50 MCG/DOSE AEPB Inhale 1 puff into the lungs daily. 05/15/20  Yes [provider]  estradiol (ESTRACE) 1 MG tablet Take 1 mg by mouth daily. 07/16/20  Yes [provider]  metFORMIN  (GLUCOPHAGE-XR) 500 MG 24 hr tablet SMARTSIG:1 Tablet(s) By Mouth Every Evening   Yes [provider]  Multiple Vitamin (MULTIVITAMIN WITH MINERALS) TABS tablet Take 1 tablet by mouth daily. Centrum Silver   Yes [provider]    Family History Family History  Problem Relation Age of Onset   Breast cancer Neg Hx     Social History Social History   Tobacco Use   Smoking status: Former    Types: Cigarettes    Quit date: 07/17/2011    Years since quitting: 11.7   Smokeless tobacco: Never  Vaping Use   Vaping Use: Never used  Substance Use Topics   Alcohol use: Yes    Comment: rarely   Drug use: Never     Allergies   Penicillins   Review of Systems Review of Systems   Physical Exam Triage Vital Signs ED Triage Vitals  Enc Vitals Group     BP 04/16/23 1641 (!) 154/84     Pulse Rate 04/16/23 1641 68     Resp 04/16/23 1641 16     Temp 04/16/23 1641 98.4 F (36.9 C)     Temp Source 04/16/23 1641 Oral     SpO2 04/16/23 1641 95 %     Weight 04/16/23 1640 172 lb (78 kg)     Height 04/16/23 1640 5' (1.524 m)     Head Circumference --      Peak Flow --      Pain Score  04/16/23 1644 2     Pain Loc --      Pain Edu? --      Excl. in GC? --    No data found.  Updated Vital Signs BP (!) 154/84 (BP Location: Left Arm)   Pulse 68   Temp 98.4 F (36.9 C) (Oral)   Resp 16   Ht 5' (1.524 m)   Wt 172 lb (78 kg)   SpO2 95%   BMI 33.59 kg/m   Visual Acuity Right Eye Distance:   Left Eye Distance:   Bilateral Distance:    Right Eye Near:   Left Eye Near:    Bilateral Near:     Physical Exam Constitutional:      Appearance: Normal appearance.  Eyes:     Extraocular Movements: Extraocular movements intact.  Pulmonary:     Effort: Pulmonary effort is normal.  Musculoskeletal:     Right lower leg: Edema present.     Left lower leg: Edema present.  Neurological:     Mental Status: She is alert and oriented to person, place, and time. Mental  status is at baseline.      UC Treatments / Results  Labs (all labs ordered are listed, but only abnormal results are displayed) Labs Reviewed - No data to display  EKG   Radiology No results found.  Procedures Procedures (including critical care time)  Medications Ordered in UC Medications - No data to display  Initial Impression / Assessment and Plan / UC Course  I have reviewed the triage vital signs and the nursing notes.  Pertinent labs & imaging results that were available during my care of the patient were reviewed by me and considered in my medical decision making (see chart for details).  Left ankle swelling  Edema present on exam, nonpitting, left ankle x-ray shows edema, negative for other findings, discussed with patient, per chart review has had leg swelling in the past without history of cardiac or nephrotic cause, as Baker's cyst rupture 2 weeks ago low suspicion for fluid buildup within the ankle space, most likely tissue swelling, discussed this with patient, recommended to take meloxicam for 7 days with supportive measures through compression, elevation, ice and heat, advised follow-up with PCP if symptoms continue to persist Final Clinical Impressions(s) / UC Diagnoses   Final diagnoses:  None   Discharge Instructions   None    ED Prescriptions   None    PDMP not reviewed this encounter.   Valinda Hoar, NP 04/16/23 [phone]

## 2023-04-16 NOTE — ED Triage Notes (Signed)
Pt c/o L foot pain & edema x7 days. Denies any falls or injuries. Was seen at [facility] on 6/19 for ruptured baker's cyst in L calf & was given meloxicam.

## 2023-04-16 NOTE — Discharge Instructions (Signed)
Today you were evaluated for your leg swelling which I believe is a muscular process related to the tissue swelling no concern for fluid buildup  X-ray does show edema but does not show any excess fluid within the joint space  Treatment of swelling is supportive care  Starting tomorrow take meloxicam every morning with food for 7 days to help reduce inflammation you may use as needed, if you run out of medicine taking over-the-counter ibuprofen or naproxen will work similarly  You may purchase a pair of compression stockings or socks to cover the ankle which help with blood flow which helps reduce swelling, may wear as needed  Whenever sitting and lying elevate legs onto pillows  May use ice or heat over the affected area 10 to 15-minute intervals for comfort  May follow-up with your primary doctor for reevaluation as needed

## 2023-10-20 ENCOUNTER — Ambulatory Visit: Payer: Self-pay

## 2023-12-28 ENCOUNTER — Other Ambulatory Visit: Payer: Self-pay

## 2023-12-28 DIAGNOSIS — Z1231 Encounter for screening mammogram for malignant neoplasm of breast: Secondary | ICD-10-CM

## 2024-02-08 ENCOUNTER — Ambulatory Visit
Admission: RE | Admit: 2024-02-08 | Discharge: 2024-02-08 | Disposition: A | Discharge status: Home / Self Care | Source: Ambulatory Visit

## 2024-02-08 DIAGNOSIS — Z1231 Encounter for screening mammogram for malignant neoplasm of breast: Secondary | ICD-10-CM | POA: Insufficient documentation
